# Patient Record
Sex: Male | Born: 1965 | Race: White | Hispanic: No | Marital: Single
Health system: Southern US, Community
[De-identification: ages and names within clinical notes are randomized; demographics above are authoritative.]

## PROBLEM LIST (undated history)

## (undated) DIAGNOSIS — K219 Gastro-esophageal reflux disease without esophagitis: Secondary | ICD-10-CM

## (undated) DIAGNOSIS — I499 Cardiac arrhythmia, unspecified: Secondary | ICD-10-CM

## (undated) DIAGNOSIS — I10 Essential (primary) hypertension: Secondary | ICD-10-CM

---

## 2015-03-10 ENCOUNTER — Inpatient Hospital Stay (HOSPITAL_COMMUNITY)
Admission: AD | Admit: 2015-03-10 | Discharge: 2015-03-14 | DRG: 392 | Disposition: A | Discharge status: Skilled Nursing Facility | Payer: Medicare Other | Source: Other Acute Inpatient Hospital | Attending: Internal Medicine | Admitting: Internal Medicine

## 2015-03-10 DIAGNOSIS — K920 Hematemesis: Secondary | ICD-10-CM | POA: Diagnosis not present

## 2015-03-10 DIAGNOSIS — Z7401 Bed confinement status: Secondary | ICD-10-CM | POA: Diagnosis not present

## 2015-03-10 DIAGNOSIS — K219 Gastro-esophageal reflux disease without esophagitis: Secondary | ICD-10-CM | POA: Diagnosis present

## 2015-03-10 DIAGNOSIS — R252 Cramp and spasm: Secondary | ICD-10-CM | POA: Diagnosis present

## 2015-03-10 DIAGNOSIS — Y732 Prosthetic and other implants, materials and accessory gastroenterology and urology devices associated with adverse incidents: Secondary | ICD-10-CM | POA: Diagnosis present

## 2015-03-10 DIAGNOSIS — Z931 Gastrostomy status: Secondary | ICD-10-CM

## 2015-03-10 DIAGNOSIS — G800 Spastic quadriplegic cerebral palsy: Secondary | ICD-10-CM | POA: Diagnosis not present

## 2015-03-10 DIAGNOSIS — D72829 Elevated white blood cell count, unspecified: Secondary | ICD-10-CM | POA: Diagnosis present

## 2015-03-10 DIAGNOSIS — Z79899 Other long term (current) drug therapy: Secondary | ICD-10-CM

## 2015-03-10 DIAGNOSIS — Z66 Do not resuscitate: Secondary | ICD-10-CM | POA: Diagnosis present

## 2015-03-10 DIAGNOSIS — R1310 Dysphagia, unspecified: Secondary | ICD-10-CM | POA: Diagnosis present

## 2015-03-10 DIAGNOSIS — Y833 Surgical operation with formation of external stoma as the cause of abnormal reaction of the patient, or of later complication, without mention of misadventure at the time of the procedure: Secondary | ICD-10-CM | POA: Diagnosis present

## 2015-03-10 DIAGNOSIS — I10 Essential (primary) hypertension: Secondary | ICD-10-CM | POA: Diagnosis present

## 2015-03-10 DIAGNOSIS — K5641 Fecal impaction: Secondary | ICD-10-CM | POA: Diagnosis present

## 2015-03-10 DIAGNOSIS — E876 Hypokalemia: Secondary | ICD-10-CM | POA: Diagnosis present

## 2015-03-10 DIAGNOSIS — K210 Gastro-esophageal reflux disease with esophagitis: Secondary | ICD-10-CM | POA: Diagnosis present

## 2015-03-10 DIAGNOSIS — R112 Nausea with vomiting, unspecified: Secondary | ICD-10-CM

## 2015-03-10 DIAGNOSIS — Z452 Encounter for adjustment and management of vascular access device: Secondary | ICD-10-CM

## 2015-03-10 DIAGNOSIS — K9423 Gastrostomy malfunction: Secondary | ICD-10-CM | POA: Diagnosis present

## 2015-03-10 DIAGNOSIS — G809 Cerebral palsy, unspecified: Secondary | ICD-10-CM | POA: Diagnosis present

## 2015-03-10 DIAGNOSIS — R258 Other abnormal involuntary movements: Secondary | ICD-10-CM

## 2015-03-10 DIAGNOSIS — R1110 Vomiting, unspecified: Secondary | ICD-10-CM | POA: Diagnosis present

## 2015-03-10 HISTORY — DX: Essential (primary) hypertension: I10

## 2015-03-10 HISTORY — DX: Gastro-esophageal reflux disease without esophagitis: K21.9

## 2015-03-10 HISTORY — DX: Cardiac arrhythmia, unspecified: I49.9

## 2015-03-10 MED ORDER — ONDANSETRON HCL 4 MG PO TABS: 4.0000 mg | ORAL_TABLET | Freq: Four times a day (QID) | ORAL | Status: DC | PRN

## 2015-03-10 MED ORDER — ONDANSETRON HCL 4 MG/2ML IJ SOLN: 4.0000 mg | Freq: Four times a day (QID) | INTRAMUSCULAR | Status: DC | PRN

## 2015-03-10 MED ORDER — BACLOFEN 20 MG PO TABS
20.0000 mg | ORAL_TABLET | Freq: Three times a day (TID) | ORAL | Status: DC
  Administered 2015-03-11 – 2015-03-14 (×5): 20 mg
  Filled 2015-03-10 (×6): qty 1

## 2015-03-10 MED ORDER — METOPROLOL TARTRATE 25 MG PO TABS
25.0000 mg | ORAL_TABLET | Freq: Three times a day (TID) | ORAL | Status: DC
  Administered 2015-03-11 – 2015-03-14 (×6): 25 mg
  Filled 2015-03-10: qty 1
  Filled 2015-03-10: qty 2
  Filled 2015-03-10 (×5): qty 1

## 2015-03-10 MED ORDER — ACETAMINOPHEN 650 MG RE SUPP: 650.0000 mg | Freq: Four times a day (QID) | RECTAL | Status: DC | PRN

## 2015-03-10 MED ORDER — GLYCOPYRROLATE 1 MG PO TABS
1.0000 mg | ORAL_TABLET | Freq: Two times a day (BID) | ORAL | Status: DC
  Administered 2015-03-11 – 2015-03-14 (×4): 1 mg
  Filled 2015-03-10 (×9): qty 1

## 2015-03-10 MED ORDER — ACETAMINOPHEN 325 MG PO TABS: 650.0000 mg | ORAL_TABLET | Freq: Four times a day (QID) | ORAL | Status: DC | PRN

## 2015-03-10 MED ORDER — IPRATROPIUM-ALBUTEROL 0.5-2.5 (3) MG/3ML IN SOLN: 3.0000 mL | RESPIRATORY_TRACT | Status: DC | PRN

## 2015-03-10 MED ORDER — PANTOPRAZOLE SODIUM 40 MG IV SOLR
40.0000 mg | Freq: Two times a day (BID) | INTRAVENOUS | Status: DC
  Administered 2015-03-11 (×2): 40 mg via INTRAVENOUS
  Filled 2015-03-10 (×2): qty 40

## 2015-03-10 MED ORDER — SODIUM CHLORIDE 0.9 % IJ SOLN
3.0000 mL | Freq: Two times a day (BID) | INTRAMUSCULAR | Status: DC
  Administered 2015-03-11 – 2015-03-14 (×4): 3 mL via INTRAVENOUS

## 2015-03-10 MED ORDER — SODIUM CHLORIDE 0.9 % IV SOLN
INTRAVENOUS | Status: DC
  Administered 2015-03-10 – 2015-03-11 (×2): via INTRAVENOUS

## 2015-03-10 MED ORDER — PHENOBARBITAL SODIUM 65 MG/ML IJ SOLN
65.0000 mg | Freq: Two times a day (BID) | INTRAMUSCULAR | Status: DC
  Administered 2015-03-11 – 2015-03-13 (×7): 65 mg via INTRAVENOUS
  Filled 2015-03-10 (×7): qty 1

## 2015-03-10 NOTE — H&P (Signed)
[facility] History and Physical  Patient: Marcus Reid  MRN: [id number]  DOB: 13-May-1966  DOS: the patient was seen and examined on 03/10/2015 PCP: No primary care provider on file.  Referring physician: [facility] Chief Complaint: Coffee-ground emesis  HPI: Marcus Reid is a 49 y.o. male with Past medical history of cerebral palsy, chronic spasticity, GERD, PEG tube placement, chronic dysphagia. Patient presents with complaints of one episode of coffee-ground emesis. The patient is a resident at her rehabilitation facility and was found to be having one episode of copious amount of coffee-ground color emesis. Patient did not have any further episodes of vomiting and evaluation. There was no fall, injury no fever or chills no diarrhea or constipation. Patient did not have any new cough. There is no recent change in his medication. Discussion with the Nursing Home Personnel the Patient Is a DO NOT RESUSCITATE and They will Faxed to Document.  The patient is coming from SNF.  At his baseline basically bedbound And is dependent for most of his ADL; does not manages his medication on his own.  Review of Systems: as mentioned in the history of present illness.  A comprehensive review of the other systems is negative.  No past medical history on file. No past surgical history on file. Social History:  has no tobacco, alcohol, and drug history on file.  Allergies  Allergen Reactions  . Cephalosporins     No family history on file.  Prior to Admission medications   Medication Sig Start Date End Date Taking? Authorizing Provider  aspirin EC 81 MG tablet Take 81 mg by mouth daily.   Yes Historical Provider, MD  baclofen (LIORESAL) 20 MG tablet Give 20 mg by tube 3 (three) times daily.   Yes Historical Provider, MD  docusate sodium (COLACE) 50 MG capsule Give 50 mg by tube 2 (two) times daily.   Yes Historical Provider, MD  FUROSEMIDE PO Take by mouth.   Yes  Historical Provider, MD  glycopyrrolate (ROBINUL) 1 MG tablet Give 1 mg by tube 2 (two) times daily.   Yes Historical Provider, MD  ipratropium-albuterol (DUONEB) 0.5-2.5 (3) MG/3ML SOLN Take 3 mLs by nebulization.   Yes Historical Provider, MD  Lactulose 20 GM/30ML SOLN Take by mouth.   Yes Historical Provider, MD  metoprolol tartrate (LOPRESSOR) 25 MG tablet Give 25 mg by tube 3 (three) times daily.   Yes Historical Provider, MD  OMEPRAZOLE PO Take by mouth.   Yes Historical Provider, MD  PHENobarbital (LUMINAL) 64.8 MG tablet Give 64.8 mg by tube 2 (two) times daily.   Yes Historical Provider, MD    Physical Exam: Filed Vitals:   03/10/15 2306  BP: 121/88  Pulse: 106  Temp: 98.7 F (37.1 C)  TempSrc: Oral  Resp: 18    General: awake but nonverbal and not following command apparently his baseline. Appear in mild distress Eyes: PERRL ENT: Oral Mucosa clear moist. Neck: no JVD Cardiovascular: S1 and S2 Present, no Murmur, Peripheral Pulses Present Respiratory: Bilateral Air entry equal and Decreased,  Clear to Auscultation, no Crackles, no wheezes Abdomen: Bowel Sound present, Soft and no tenderness Skin: no Rash Extremities: no Pedal edema, no calf tenderness Neurologic: Grossly no focal neuro deficit.  Labs on Admission:  CBC:  Recent Labs Lab 03/10/15 2359 03/11/15 0545  WBC 16.9* 14.0*  NEUTROABS 13.2* 10.7*  HGB 14.4 13.8  HCT 42.7 41.6  MCV 96.2 96.3  PLT 443* 410*    CMP  Component Value Date/Time   NA 137 03/11/2015 0545   K 3.2* 03/11/2015 0545   CL 98* 03/11/2015 0545   CO2 31 03/11/2015 0545   GLUCOSE 99 03/11/2015 0545   BUN 15 03/11/2015 0545   CREATININE 0.49* 03/11/2015 0545   CALCIUM 8.7* 03/11/2015 0545   PROT 6.3* 03/11/2015 0545   ALBUMIN 3.0* 03/11/2015 0545   AST 19 03/11/2015 0545   ALT 20 03/11/2015 0545   ALKPHOS 99 03/11/2015 0545   BILITOT 0.4 03/11/2015 0545   GFRNONAA >60 03/11/2015 0545   GFRAA >60 03/11/2015 0545     No results for input(s): CKTOTAL, CKMB, CKMBINDEX, TROPONINI in the last 168 hours. BNP (last 3 results) No results for input(s): BNP in the last 8760 hours.  ProBNP (last 3 results) No results for input(s): PROBNP in the last 8760 hours.   Radiological Exams on Admission: Portable Chest 1 View  03/11/2015   CLINICAL DATA:  Nausea and vomiting.  EXAM: PORTABLE CHEST 1 VIEW  COMPARISON:  None.  FINDINGS: There is a right jugular central line with tip in the SVC just below the azygos vein junction. The lungs are clear. There is no pneumothorax. There is no large effusion. Pulmonary vasculature is normal.  IMPRESSION: No acute cardiopulmonary findings. Satisfactorily positioned right jugular central line.   Electronically Signed   By: Ellery Plunk M.D.   On: 03/11/2015 03:07   Dg Abd Portable 1v  03/11/2015   CLINICAL DATA:  Acute onset of nausea and vomiting. G-tube placement. Initial encounter.  EXAM: PORTABLE ABDOMEN - 1 VIEW  COMPARISON:  None.  FINDINGS: The patient's G-tube is noted ending overlying the antrum of the stomach.  The visualized bowel gas pattern is grossly unremarkable. Scattered air and stool are seen within the colon. No bowel dilatation is seen to suggest obstruction. There is dilatation of the rectum to 10.9 cm with dense stool, compatible with fecal impaction. No free intra-abdominal air is seen, though evaluation for free air is limited on a single supine view.  No acute osseous abnormalities are identified.  IMPRESSION: 1. Dilatation of the rectum to 10.9 cm with dense stool, compatible with fecal impaction. 2. Otherwise unremarkable bowel gas pattern. No free intra-abdominal air seen.   Electronically Signed   By: Roanna Raider M.D.   On: 03/11/2015 03:08   Assessment/Plan 1. Coffee ground emesis The patient presents with complaints of one episode of coffee-ground emesis. At present his H&H is stable he's hemodynamically stable. Patient was discussed with GI  before transfer here. At present he will receive Protonix every 12 hours. He remains nothing by mouth. Next and holding his feeding as well.  2.   Cerebral palsy [facility]) Patient has chronic spasticity secondary to cerebral palsy continue his home medications.  3  S/P percutaneous endoscopic gastrostomy (PEG) tube placement [facility]) Checking x-ray abdomen 2 ensure proper PEG tube placement.  4  Leucocytosis Most likely in distress. At present I do not have any evidence of ongoing infection would continue close monitoring.  5 poor IV access. Patient presents with complaints of coffee-ground emesis. Patient did have poor IV access and the other facility and the attempted and right IJ. Activity other facility was a satisfactory. At present I'll recheck in the x-ray to make sure that the position is maintaining appropriately.  Nutrition: npo DVT Prophylaxis: mechanical compression device  Advance goals of care discussion: DO NOT RESUSCITATE as per discussion with the nursing home facility   Consults: Gastroenterology  Disposition: Admitted  as inpatient, telemetry unit.  Author: Lynden Oxford, MD [facility] Pager: [phone] 03/10/2015  If 7PM-7AM, please contact night-coverage [email or web address] Password [email or web address]

## 2015-03-11 ENCOUNTER — Inpatient Hospital Stay (HOSPITAL_COMMUNITY): Payer: Medicare Other

## 2015-03-11 DIAGNOSIS — R252 Cramp and spasm: Secondary | ICD-10-CM | POA: Diagnosis present

## 2015-03-11 DIAGNOSIS — G809 Cerebral palsy, unspecified: Secondary | ICD-10-CM | POA: Diagnosis present

## 2015-03-11 DIAGNOSIS — D72829 Elevated white blood cell count, unspecified: Secondary | ICD-10-CM | POA: Diagnosis present

## 2015-03-11 DIAGNOSIS — Z931 Gastrostomy status: Secondary | ICD-10-CM

## 2015-03-11 LAB — CBC WITH DIFFERENTIAL/PLATELET
BASOS ABS: 0 10*3/uL (ref 0.0–0.1)
BASOS PCT: 0 %
Basophils Absolute: 0 10*3/uL (ref 0.0–0.1)
Basophils Relative: 0 %
EOS ABS: 0.1 10*3/uL (ref 0.0–0.7)
EOS ABS: 0 10*3/uL (ref 0.0–0.7)
EOS PCT: 1 %
Eosinophils Relative: 0 %
HCT: 41.6 % (ref 39.0–52.0)
HCT: 42.7 % (ref 39.0–52.0)
HEMOGLOBIN: 14.4 g/dL (ref 13.0–17.0)
Hemoglobin: 13.8 g/dL (ref 13.0–17.0)
LYMPHS ABS: 2.6 10*3/uL (ref 0.7–4.0)
LYMPHS PCT: 15 %
Lymphocytes Relative: 15 %
Lymphs Abs: 2.1 10*3/uL (ref 0.7–4.0)
MCH: 31.9 pg (ref 26.0–34.0)
MCH: 32.4 pg (ref 26.0–34.0)
MCHC: 33.2 g/dL (ref 30.0–36.0)
MCHC: 33.7 g/dL (ref 30.0–36.0)
MCV: 96.2 fL (ref 78.0–100.0)
MCV: 96.3 fL (ref 78.0–100.0)
MONO ABS: 1.1 10*3/uL — AB (ref 0.1–1.0)
Monocytes Absolute: 1.2 10*3/uL — ABNORMAL HIGH (ref 0.1–1.0)
Monocytes Relative: 7 %
Monocytes Relative: 8 %
NEUTROS PCT: 78 %
Neutro Abs: 10.7 10*3/uL — ABNORMAL HIGH (ref 1.7–7.7)
Neutro Abs: 13.2 10*3/uL — ABNORMAL HIGH (ref 1.7–7.7)
Neutrophils Relative %: 76 %
PLATELETS: 410 10*3/uL — AB (ref 150–400)
Platelets: 443 10*3/uL — ABNORMAL HIGH (ref 150–400)
RBC: 4.32 MIL/uL (ref 4.22–5.81)
RBC: 4.44 MIL/uL (ref 4.22–5.81)
RDW: 13.9 % (ref 11.5–15.5)
RDW: 14 % (ref 11.5–15.5)
WBC: 14 10*3/uL — ABNORMAL HIGH (ref 4.0–10.5)
WBC: 16.9 10*3/uL — AB (ref 4.0–10.5)

## 2015-03-11 LAB — COMPREHENSIVE METABOLIC PANEL
ALBUMIN: 3 g/dL — AB (ref 3.5–5.0)
ALK PHOS: 97 U/L (ref 38–126)
ALT: 20 U/L (ref 17–63)
ALT: 24 U/L (ref 17–63)
ANION GAP: 8 (ref 5–15)
AST: 26 U/L (ref 15–41)
AST: 19 U/L (ref 15–41)
Albumin: 3.1 g/dL — ABNORMAL LOW (ref 3.5–5.0)
Alkaline Phosphatase: 99 U/L (ref 38–126)
Anion gap: 11 (ref 5–15)
BUN: 15 mg/dL (ref 6–20)
BUN: 17 mg/dL (ref 6–20)
CALCIUM: 9.2 mg/dL (ref 8.9–10.3)
CHLORIDE: 98 mmol/L — AB (ref 101–111)
CO2: 31 mmol/L (ref 22–32)
CO2: 31 mmol/L (ref 22–32)
CREATININE: 0.43 mg/dL — AB (ref 0.61–1.24)
Calcium: 8.7 mg/dL — ABNORMAL LOW (ref 8.9–10.3)
Chloride: 99 mmol/L — ABNORMAL LOW (ref 101–111)
Creatinine, Ser: 0.49 mg/dL — ABNORMAL LOW (ref 0.61–1.24)
GFR calc Af Amer: >60 mL/min (ref >60)
GFR calc non Af Amer: >60 mL/min (ref >60)
GFR calc non Af Amer: >60 mL/min (ref >60)
GLUCOSE: 99 mg/dL (ref 65–99)
GLUCOSE: 94 mg/dL (ref 65–99)
POTASSIUM: 3.2 mmol/L — AB (ref 3.5–5.1)
Potassium: 3.2 mmol/L — ABNORMAL LOW (ref 3.5–5.1)
SODIUM: 141 mmol/L (ref 135–145)
Sodium: 137 mmol/L (ref 135–145)
Total Bilirubin: 0.5 mg/dL (ref 0.3–1.2)
Total Bilirubin: 0.4 mg/dL (ref 0.3–1.2)
Total Protein: 7 g/dL (ref 6.5–8.1)
Total Protein: 6.3 g/dL — ABNORMAL LOW (ref 6.5–8.1)

## 2015-03-11 LAB — PROTIME-INR
INR: 1.13 (ref 0.00–1.49)
Prothrombin Time: 14.7 seconds (ref 11.6–15.2)

## 2015-03-11 LAB — RETICULOCYTES
RBC.: 4.44 MIL/uL (ref 4.22–5.81)
Retic Count, Absolute: 57.7 10*3/uL (ref 19.0–186.0)
Retic Ct Pct: 1.3 % (ref 0.4–3.1)

## 2015-03-11 LAB — LACTIC ACID, PLASMA
LACTIC ACID, VENOUS: 1.2 mmol/L (ref 0.5–2.0)
LACTIC ACID, VENOUS: 2.1 mmol/L — AB (ref 0.5–2.0)

## 2015-03-11 LAB — ABO/RH: ABO/RH(D): A POS

## 2015-03-11 LAB — TYPE AND SCREEN
ABO/RH(D): A POS
ANTIBODY SCREEN: NEGATIVE

## 2015-03-11 LAB — MRSA PCR SCREENING: MRSA by PCR: NEGATIVE

## 2015-03-11 LAB — GLUCOSE, CAPILLARY: Glucose-Capillary: 74 mg/dL (ref 65–99)

## 2015-03-11 MED ORDER — JEVITY 1.2 CAL PO LIQD: 1000.0000 mL | ORAL | Status: DC

## 2015-03-11 MED ORDER — OSMOLITE 1.2 CAL PO LIQD
1000.0000 mL | ORAL | Status: DC
  Administered 2015-03-12: 1000 mL
  Filled 2015-03-11 (×6): qty 1000

## 2015-03-11 MED ORDER — BISACODYL 10 MG RE SUPP: 10.0000 mg | Freq: Every day | RECTAL | Status: DC | PRN

## 2015-03-11 MED ORDER — SODIUM CHLORIDE 0.9 % IJ SOLN
10.0000 mL | INTRAMUSCULAR | Status: DC | PRN
  Administered 2015-03-11 (×2): 20 mL
  Administered 2015-03-11: 40 mL
  Administered 2015-03-12: 20 mL
  Administered 2015-03-12 – 2015-03-14 (×4): 10 mL
  Filled 2015-03-11 (×7): qty 40

## 2015-03-11 MED ORDER — PANTOPRAZOLE SODIUM 40 MG PO PACK
40.0000 mg | PACK | Freq: Two times a day (BID) | ORAL | Status: DC
  Administered 2015-03-12 – 2015-03-14 (×3): 40 mg
  Filled 2015-03-11 (×9): qty 20

## 2015-03-11 MED ORDER — POLYETHYLENE GLYCOL 3350 17 G PO PACK
34.0000 g | PACK | Freq: Every day | ORAL | Status: AC
  Administered 2015-03-12: 34 g
  Filled 2015-03-11 (×2): qty 2

## 2015-03-11 MED ORDER — POTASSIUM CHLORIDE CRYS ER 20 MEQ PO TBCR
30.0000 meq | EXTENDED_RELEASE_TABLET | Freq: Two times a day (BID) | ORAL | Status: AC
  Filled 2015-03-11 (×2): qty 1

## 2015-03-11 MED ORDER — SODIUM BICARBONATE 650 MG PO TABS
650.0000 mg | ORAL_TABLET | Freq: Once | ORAL | Status: AC
  Administered 2015-03-11: 650 mg via ORAL
  Filled 2015-03-11: qty 1

## 2015-03-11 MED ORDER — PANCRELIPASE (LIP-PROT-AMYL) 12000-38000 UNITS PO CPEP
24000.0000 [IU] | ORAL_CAPSULE | Freq: Once | ORAL | Status: AC
  Administered 2015-03-11: 24000 [IU] via ORAL
  Filled 2015-03-11: qty 2

## 2015-03-11 NOTE — Plan of Care (Signed)
Problem: Phase I Progression Outcomes Goal: OOB as tolerated unless otherwise ordered Outcome: Not Met (add Reason) Pt has chronic foley d/t paralysis     

## 2015-03-11 NOTE — Progress Notes (Signed)
Spoke with MD Elgergawy via phone whom stated he will put in order for interventional radiology to de-clog PEG tube tomorrow morning.

## 2015-03-11 NOTE — Progress Notes (Signed)
MD Elgergawy paged, despite multiple attempts over several hours including pharmacological declogging ordered and recommended by pharmacy patient PEG tube is still clogged and will not flush.

## 2015-03-11 NOTE — Progress Notes (Addendum)
Initial Nutrition Assessment  DOCUMENTATION CODES:   Not applicable  INTERVENTION:   Initiate Osmolite 1.2 @ 34ml/hr via PEG and increase by 10 ml every 4 hours to goal rate of 60 ml/hr.   Tube feeding regimen provides 1728 kcal (100% of needs), 80 grams of protein, and 1181 ml of H2O.   NUTRITION DIAGNOSIS:   Inadequate oral intake related to inability to eat as evidenced by NPO status.  GOAL:   Patient will meet greater than or equal to 90% of their needs  MONITOR:   Labs, Weight trends, TF tolerance, Skin, I & O's  REASON FOR ASSESSMENT:   Low Braden, Consult Enteral/tube feeding initiation and management  ASSESSMENT:   Marcus Reid is a 49 y.o. male with Past medical history of cerebral palsy, chronic spasticity, GERD, PEG tube placement, chronic dysphagia. The patient is a resident at her rehabilitation facility and was found to be having one episode of copious amount of coffee-ground color emesis  Pt admitted with coffee ground emesis.   Pt is a resident of [address]. He is PEG dependent, due to dysphagia. Reviewed GI note; suspecting emesis is due to reflux esophagitis-mediated.   Called SNF in attempt to obtain home TF regimen, however, this RD was unable to reach nursing staff at time of call. Pt is non-communicative and unable to provide any hx.  Nutrition-Focused physical exam completed. Findings are no fat depletion, no muscle depletion, and no edema.   Labs reviewed: K: 3.2 (on supplement).  Diet Order:  Diet NPO time specified  Skin:  Reviewed, no issues  Last BM:  03/10/15  Height:   Ht Readings from Last 1 Encounters:  03/10/15  (1.702 m)    Weight:   Wt Readings from Last 1 Encounters:  03/11/15 150 lb 5.7 oz (68.2 kg)    Ideal Body Weight:  67.3 kg  BMI:  Body mass index is 23.54 kg/(m^2).  Estimated Nutritional Needs:   Kcal:  1700-1900  Protein:  75-85 grams  Fluid:  1.7-1.9 L  EDUCATION NEEDS:   No  education needs identified at this time  Kirsten Spearing A. Mayford Knife, RD, LDN, CDE Pager: [phone] After hours Pager: [phone]

## 2015-03-11 NOTE — Progress Notes (Addendum)
Patient PEG tubed clogged despite multiple attempts by Jaynie Collins, per MD Elgergawy Dobhoff tube ordered by secretary Deanna for himself to try and de-clog PEG tube at bedside. Tube feedings on hold as well as any medications administered through PEG tube, MD Elgergawy aware.

## 2015-03-11 NOTE — Progress Notes (Signed)
Asked secretary Pattricia Boss to order kangaroo pump whom verbalized she would do this.

## 2015-03-11 NOTE — Progress Notes (Signed)
Patient Demographics  Marcus Reid, is a 49 y.o. male, DOB - April 04, 1966, ZOX:[id number]  Admit date - 03/10/2015   Admitting Physician Alberteen Sam, MD  Outpatient Primary MD for the patient is No primary care provider on file.  LOS - 1   No chief complaint on file.      Admission HPI/Brief narrative: 49 y.o. male with Past medical history of cerebral palsy, chronic spasticity, GERD, PEG tube placement, chronic dysphagia.Patient presents with complaints of one episode of coffee-ground emesis, hemoglobin is stable, no further recurrence of coffee-ground emesis during hospital stay, GI has been consulted.  Subjective:   Marcus Reid today is non-verbal, can't provide any complaints.  Assessment & Plan    Principal Problem:   Coffee ground emesis Active Problems:   Cerebral palsy (HCC)   S/P percutaneous endoscopic gastrostomy (PEG) tube placement (HCC)   Leucocytosis   Spasticity  Coffee ground emesis - patient presents with complaints of one episode of coffee-ground emesis,  - H&H remains stable,on Protonix every 12 hours, continue to hold aspirin, keep nothing by mouth pending GI evaluation]  Cerebral palsy [facility]) Patient has chronic spasticity secondary to cerebral palsy continue his home medications.  S/P percutaneous endoscopic gastrostomy (PEG) tube placement  - Patient has PEG tube for few years now as per SNF facility - Continue to hold tube feeds.  Hypertension - Continue with metoprolol  Leucocytosis - Most likely related to distress, a febrile, negative chest x-ray, leukocytosis trending down, continue to monitor.  poor IV access  - Has a right IJ TLC inserted by OSH.  Fecal impaction evident on x-ray,  - Will start on laxatives, nurse will attempt disimpaction.  Hypokalemia - Repleted, recheck in a.m.  Code Status: DNR confirmed by SNF  Family  Communication: Spoke with [name] [phone] on 10/3  Disposition Plan: Back to SNF when stable   Procedures  None   Consults   GI   Medications  Scheduled Meds: . baclofen  20 mg Per Tube TID  . glycopyrrolate  1 mg Per Tube BID  . metoprolol tartrate  25 mg Per Tube 3 times per day  . pantoprazole (PROTONIX) IV  40 mg Intravenous Q12H  . PHENObarbital  65 mg Intravenous BID  . sodium chloride  3 mL Intravenous Q12H   Continuous Infusions: . sodium chloride 100 mL/hr at 03/11/15 0328   PRN Meds:.acetaminophen **OR** acetaminophen, bisacodyl, ipratropium-albuterol, ondansetron **OR** ondansetron (ZOFRAN) IV, sodium chloride  DVT Prophylaxis  SCDs   Lab Results  Component Value Date   PLT 410* 03/11/2015    Antibiotics    Anti-infectives    None          Objective:   Filed Vitals:   03/10/15 2306 03/11/15 0500 03/11/15 0559  BP: 121/88  115/86  Pulse: 106  100  Temp: 98.7 F (37.1 C)  98.3 F (36.8 C)  TempSrc: Oral  Oral  Resp: 18  18  Height:  (1.702 m)    Weight: 68 kg (149 lb 14.6 oz) 68.2 kg (150 lb 5.7 oz)   SpO2:   98%    Wt Readings from Last 3 Encounters:  03/11/15 68.2 kg (150 lb 5.7 oz)     Intake/Output Summary (  Last 24 hours) at 03/11/15 1054 Last data filed at 03/11/15 0858  Gross per 24 hour  Intake      0 ml  Output    201 ml  Net   -201 ml     Physical Exam  Sleeping comfortably, nonverbal. Supple Neck,No JVD, right IJ TLC  Symmetrical Chest wall movement, Good air movement bilaterally,  RRR,No Gallops,Rubs , No Parasternal Heave +ve B.Sounds, Abd Soft, No tenderness,PEG +. Contracted.   Data Review   Micro Results Recent Results (from the past 240 hour(s))  MRSA PCR Screening     Status: None   Collection Time: 03/10/15 11:05 PM  Result Value Ref Range Status   MRSA by PCR NEGATIVE NEGATIVE Final    Comment:        The GeneXpert MRSA Assay (FDA approved for NASAL specimens only), is one  component of a comprehensive MRSA colonization surveillance program. It is not intended to diagnose MRSA infection nor to guide or monitor treatment for MRSA infections.     Radiology Reports Portable Chest 1 View  03/11/2015   CLINICAL DATA:  Nausea and vomiting.  EXAM: PORTABLE CHEST 1 VIEW  COMPARISON:  None.  FINDINGS: There is a right jugular central line with tip in the SVC just below the azygos vein junction. The lungs are clear. There is no pneumothorax. There is no large effusion. Pulmonary vasculature is normal.  IMPRESSION: No acute cardiopulmonary findings. Satisfactorily positioned right jugular central line.   Electronically Signed   By: Ellery Plunk M.D.   On: 03/11/2015 03:07   Dg Abd Portable 1v  03/11/2015   CLINICAL DATA:  Acute onset of nausea and vomiting. G-tube placement. Initial encounter.  EXAM: PORTABLE ABDOMEN - 1 VIEW  COMPARISON:  None.  FINDINGS: The patient's G-tube is noted ending overlying the antrum of the stomach.  The visualized bowel gas pattern is grossly unremarkable. Scattered air and stool are seen within the colon. No bowel dilatation is seen to suggest obstruction. There is dilatation of the rectum to 10.9 cm with dense stool, compatible with fecal impaction. No free intra-abdominal air is seen, though evaluation for free air is limited on a single supine view.  No acute osseous abnormalities are identified.  IMPRESSION: 1. Dilatation of the rectum to 10.9 cm with dense stool, compatible with fecal impaction. 2. Otherwise unremarkable bowel gas pattern. No free intra-abdominal air seen.   Electronically Signed   By: Roanna Raider M.D.   On: 03/11/2015 03:08     CBC  Recent Labs Lab 03/10/15 2359 03/11/15 0545  WBC 16.9* 14.0*  HGB 14.4 13.8  HCT 42.7 41.6  PLT 443* 410*  MCV 96.2 96.3  MCH 32.4 31.9  MCHC 33.7 33.2  RDW 13.9 14.0  LYMPHSABS 2.6 2.1  MONOABS 1.2* 1.1*  EOSABS 0.0 0.1  BASOSABS 0.0 0.0    Chemistries   Recent  Labs Lab 03/10/15 2359 03/11/15 0545  NA 141 137  K 3.2* 3.2*  CL 99* 98*  CO2 31 31  GLUCOSE 94 99  BUN 17 15  CREATININE 0.43* 0.49*  CALCIUM 9.2 8.7*  AST 26 19  ALT 24 20  ALKPHOS 97 99  BILITOT 0.5 0.4   ------------------------------------------------------------------------------------------------------------------ estimated creatinine clearance is 105.6 mL/min (by C-G formula based on Cr of 0.49). ------------------------------------------------------------------------------------------------------------------ No results for input(s): HGBA1C in the last 72 hours. ------------------------------------------------------------------------------------------------------------------ No results for input(s): CHOL, HDL, LDLCALC, TRIG, CHOLHDL, LDLDIRECT in the last 72 hours. ------------------------------------------------------------------------------------------------------------------ No results for input(s):  TSH, T4TOTAL, T3FREE, THYROIDAB in the last 72 hours.  Invalid input(s): FREET3 ------------------------------------------------------------------------------------------------------------------  Recent Labs  03/10/15 2359  RETICCTPCT 1.3    Coagulation profile  Recent Labs Lab 03/10/15 2359  INR 1.13    No results for input(s): DDIMER in the last 72 hours.  Cardiac Enzymes No results for input(s): CKMB, TROPONINI, MYOGLOBIN in the last 168 hours.  Invalid input(s): CK ------------------------------------------------------------------------------------------------------------------ Invalid input(s): POCBNP     Time Spent in minutes   30 minutes   ELGERGAWY, DAWOOD M.D on 03/11/2015 at 10:54 AM  Between 7am to 7pm - Pager - [phone]  After 7pm go to [email or web address] - password [facility]  [facility]   Office  [phone]

## 2015-03-11 NOTE — Progress Notes (Signed)
MD Elgergawy paged, Dobhoff tube has arrived and is at bedside.

## 2015-03-11 NOTE — Plan of Care (Signed)
Problem: Phase I Progression Outcomes Goal: OOB as tolerated unless otherwise ordered Outcome: Not Met (add Reason) Pt is quadraplegic.

## 2015-03-11 NOTE — Care Management Note (Addendum)
Case Management Note  Patient Details  Name: Marcus Reid MRN: [id number] Date of Birth: [birth year]  Subjective/Objective:                  Patient from [facility] with coffee ground emesis.  GI consult pending.   Action/Plan:  Anticiapte DC to SNF. Will be facilitated through SW.   03-14-15 DC to SNF today through SW.   Expected Discharge Date:                  Expected Discharge Plan:  Skilled Nursing Facility  In-House Referral:  Clinical Social Work  Discharge planning Services  CM Consult  Post Acute Care Choice:    Choice offered to:     DME Arranged:    DME Agency:     HH Arranged:    HH Agency:     Status of Service:  In process, will continue to follow  Medicare Important Message Given:    Date Medicare IM Given:    Medicare IM give by:    Date Additional Medicare IM Given:    Additional Medicare Important Message give by:     If discussed at Long Length of Stay Meetings, dates discussed:    Additional Comments:  Lawerance Sabal, RN 03/11/2015, 1:43 PM

## 2015-03-11 NOTE — Consult Note (Signed)
[facility] Consultation Note  Referring Provider: Dr.  Mliss Fritz Elgergawy [facility]) Primary Care Physician:  No primary care provider on file.  Reason for Consultation:  Coffee ground emesis  HPI: Marcus Reid is a 49 y.o. male with cerebral palsy and resides at residential facility, transferred here for evaluation of coffee ground emesis at nursing facility.  No bleeding since his arrival, no reported blood in stool or per PEG.  No further hematemesis noted here.  Unable to obtain any history given patient's cerebral palsy and non-verbal and non-communicative state.   No past medical history on file.  No past surgical history on file.  Prior to Admission medications   Medication Sig Start Date End Date Taking? Authorizing Provider  aspirin EC 81 MG tablet Take 81 mg by mouth daily.   Yes Historical Provider, MD  baclofen (LIORESAL) 20 MG tablet Give 20 mg by tube 3 (three) times daily.   Yes Historical Provider, MD  docusate sodium (COLACE) 50 MG capsule Give 50 mg by tube 2 (two) times daily.   Yes Historical Provider, MD  FUROSEMIDE PO Take by mouth.   Yes Historical Provider, MD  glycopyrrolate (ROBINUL) 1 MG tablet Give 1 mg by tube 2 (two) times daily.   Yes Historical Provider, MD  ipratropium-albuterol (DUONEB) 0.5-2.5 (3) MG/3ML SOLN Take 3 mLs by nebulization.   Yes Historical Provider, MD  Lactulose 20 GM/30ML SOLN Take by mouth.   Yes Historical Provider, MD  metoprolol tartrate (LOPRESSOR) 25 MG tablet Give 25 mg by tube 3 (three) times daily.   Yes Historical Provider, MD  OMEPRAZOLE PO Take by mouth.   Yes Historical Provider, MD  PHENobarbital (LUMINAL) 64.8 MG tablet Give 64.8 mg by tube 2 (two) times daily.   Yes Historical Provider, MD    Current Facility-Administered Medications  Medication Dose Route Frequency Provider Last Rate Last Dose  . 0.9 %  sodium chloride infusion   Intravenous Continuous Rolly Salter, MD 100 mL/hr at 03/11/15 0328    .  acetaminophen (TYLENOL) tablet 650 mg  650 mg Oral Q6H PRN Rolly Salter, MD       Or  . acetaminophen (TYLENOL) suppository 650 mg  650 mg Rectal Q6H PRN Rolly Salter, MD      . baclofen (LIORESAL) tablet 20 mg  20 mg Per Tube TID Rolly Salter, MD   20 mg at 03/11/15 0850  . bisacodyl (DULCOLAX) suppository 10 mg  10 mg Rectal Daily PRN Rolly Salter, MD      . glycopyrrolate (ROBINUL) tablet 1 mg  1 mg Per Tube BID Rolly Salter, MD   1 mg at 03/11/15 1015  . ipratropium-albuterol (DUONEB) 0.5-2.5 (3) MG/3ML nebulizer solution 3 mL  3 mL Nebulization Q4H PRN Rolly Salter, MD      . metoprolol tartrate (LOPRESSOR) tablet 25 mg  25 mg Per Tube 3 times per day Rolly Salter, MD   25 mg at 03/11/15 1610  . ondansetron (ZOFRAN) tablet 4 mg  4 mg Oral Q6H PRN Rolly Salter, MD       Or  . ondansetron [facility]) injection 4 mg  4 mg Intravenous Q6H PRN Rolly Salter, MD      . pantoprazole (PROTONIX) injection 40 mg  40 mg Intravenous Q12H Rolly Salter, MD   40 mg at 03/11/15 0849  . PHENObarbital (LUMINAL) injection 65 mg  65 mg Intravenous BID Rolly Salter, MD   65 mg  at 03/11/15 0849  . polyethylene glycol (MIRALAX / GLYCOLAX) packet 34 g  34 g Per Tube Daily Dawood S Elgergawy, MD      . potassium chloride (K-DUR,KLOR-CON) CR tablet 30 mEq  30 mEq Oral BID Leana Roe Elgergawy, MD      . sodium chloride 0.9 % injection 10-40 mL  10-40 mL Intracatheter PRN Rolly Salter, MD   20 mL at 03/11/15 0548  . sodium chloride 0.9 % injection 3 mL  3 mL Intravenous Q12H Rolly Salter, MD   3 mL at 03/11/15 0850    Allergies as of 03/10/2015 - Review Complete 03/10/2015  Allergen Reaction Noted  . Cephalosporins  03/10/2015    No family history on file.  Social History   Social History  . Marital Status: Single    Spouse Name: N/A  . Number of Children: N/A  . Years of Education: N/A   Occupational History  . Not on file.   Social History Main Topics  . Smoking status: Not on  file  . Smokeless tobacco: Not on file  . Alcohol Use: Not on file  . Drug Use: Not on file  . Sexual Activity: Not on file   Other Topics Concern  . Not on file   Social History Narrative  . No narrative on file    Review of Systems: Unable to obtain due to his non-verbal and non-communicative state  Physical Exam: Vital signs in last 24 hours: Temp:  [98.3 F (36.8 C)-98.7 F (37.1 C)] 98.3 F (36.8 C) (10/03 0559) Pulse Rate:  [100-106] 100 (10/03 0559) Resp:  [18] 18 (10/03 0559) BP: (115-121)/(86-88) 115/86 mmHg (10/03 0559) SpO2:  [98 %] 98 % (10/03 0559) Weight:  [68 kg (149 lb 14.6 oz)-68.2 kg (150 lb 5.7 oz)] 68.2 kg (150 lb 5.7 oz) (10/03 0500) Last BM Date: 03/10/15 General:  Contractured, unable to make eye contact or communicate Head:  Normocephalic and atraumatic. Eyes:  Sclera clear, no icterus.   Conjunctiva pink. Ears:  Unable to tell, since he can't communicate or provide signal to my questions Nose:  No deformity, discharge,  or lesions. Mouth:  No deformity or lesions.  Oropharynx pink & moist. Neck:  Central line noted right IJ site.; Supple; no masses or thyromegaly. Lungs:  Clear throughout to auscultation.   No wheezes, crackles, or rhonchi. No acute distress. Heart:  Regular rate and rhythm; no murmurs, clicks, rubs,  or gallops. Abdomen:  Soft, nontender and nondistended; PEG site noted, no weeping or effluent. No masses, hepatosplenomegaly or hernias noted. Normal bowel sounds, without guarding, and without rebound.     Msk:  Contractured diffusely, with tendency to left side Pulses:  Normal pulses noted. Extremities:  Contracted; without clubbing or edema. Neurologic:  Awake but can not tract or follow commands or answer questions Skin:  Bit damp and diaphoretic, otherwise intact without significant lesions or rashes. Cervical Nodes:  No significant cervical adenopathy. Psych:  Alert and cooperative. Normal mood and affect.   Lab  Results:  Recent Labs  03/10/15 2359 03/11/15 0545  WBC 16.9* 14.0*  HGB 14.4 13.8  HCT 42.7 41.6  PLT 443* 410*   BMET  Recent Labs  03/10/15 2359 03/11/15 0545  NA 141 137  K 3.2* 3.2*  CL 99* 98*  CO2 31 31  GLUCOSE 94 99  BUN 17 15  CREATININE 0.43* 0.49*  CALCIUM 9.2 8.7*   LFT  Recent Labs  03/11/15 0545  PROT 6.3*  ALBUMIN 3.0*  AST 19  ALT 20  ALKPHOS 99  BILITOT 0.4   PT/INR  Recent Labs  03/10/15 2359  LABPROT 14.7  INR 1.13    Studies/Results: Portable Chest 1 View  03/11/2015   CLINICAL DATA:  Nausea and vomiting.  EXAM: PORTABLE CHEST 1 VIEW  COMPARISON:  None.  FINDINGS: There is a right jugular central line with tip in the SVC just below the azygos vein junction. The lungs are clear. There is no pneumothorax. There is no large effusion. Pulmonary vasculature is normal.  IMPRESSION: No acute cardiopulmonary findings. Satisfactorily positioned right jugular central line.   Electronically Signed   By: Ellery Plunk M.D.   On: 03/11/2015 03:07   Dg Abd Portable 1v  03/11/2015   CLINICAL DATA:  Acute onset of nausea and vomiting. G-tube placement. Initial encounter.  EXAM: PORTABLE ABDOMEN - 1 VIEW  COMPARISON:  None.  FINDINGS: The patient's G-tube is noted ending overlying the antrum of the stomach.  The visualized bowel gas pattern is grossly unremarkable. Scattered air and stool are seen within the colon. No bowel dilatation is seen to suggest obstruction. There is dilatation of the rectum to 10.9 cm with dense stool, compatible with fecal impaction. No free intra-abdominal air is seen, though evaluation for free air is limited on a single supine view.  No acute osseous abnormalities are identified.  IMPRESSION: 1. Dilatation of the rectum to 10.9 cm with dense stool, compatible with fecal impaction. 2. Otherwise unremarkable bowel gas pattern. No free intra-abdominal air seen.   Electronically Signed   By: Roanna Raider M.D.   On: 03/11/2015  03:08   Impression:  1.  Coffee ground emesis at nursing facility by report.  No overt GI bleeding since admission.  Suspect this is likely reflux esophagitis-mediated. 2.  Cerebral palsy with chronic dysphagia, chronic PEG feeds.  Plan:  1.  PPI per PEG tube (equivalent of 40 mg po bid), now and indefinitely. 2.  Reflux precautions (keep head-of-bed elevated 30-45 degrees at all times). 3.  Small, frequent PEG feedings, would avoid large bolus feeds, as might predispose to reflux. 4.  Can flush PEG tube to assess for blood if clinical suspicion of ongoing bleeding persists. 5.  Don't see need for endoscopy based on patient's current symptoms. 6.  Will sign-off; thank you for the consultation.   LOS: 1 day   Onyx Edgley M  03/11/2015, 12:03 PM  Pager [phone] If no answer or after 5 PM call [phone]

## 2015-03-12 ENCOUNTER — Inpatient Hospital Stay (HOSPITAL_COMMUNITY): Payer: Medicare Other

## 2015-03-12 LAB — URINALYSIS, ROUTINE W REFLEX MICROSCOPIC
BILIRUBIN URINE: NEGATIVE
Glucose, UA: NEGATIVE mg/dL
NITRITE: NEGATIVE
PROTEIN: NEGATIVE mg/dL
Specific Gravity, Urine: 1.019 (ref 1.005–1.030)
UROBILINOGEN UA: 1 mg/dL (ref 0.0–1.0)
pH: 7 (ref 5.0–8.0)

## 2015-03-12 LAB — BASIC METABOLIC PANEL
Anion gap: 6 (ref 5–15)
BUN: 12 mg/dL (ref 6–20)
CHLORIDE: 106 mmol/L (ref 101–111)
CO2: 29 mmol/L (ref 22–32)
Calcium: 8.3 mg/dL — ABNORMAL LOW (ref 8.9–10.3)
Creatinine, Ser: 0.42 mg/dL — ABNORMAL LOW (ref 0.61–1.24)
GFR calc Af Amer: >60 mL/min (ref >60)
GFR calc non Af Amer: >60 mL/min (ref >60)
GLUCOSE: 75 mg/dL (ref 65–99)
POTASSIUM: 3.2 mmol/L — AB (ref 3.5–5.1)
Sodium: 141 mmol/L (ref 135–145)

## 2015-03-12 LAB — CBC
HCT: 36.4 % — ABNORMAL LOW (ref 39.0–52.0)
HEMOGLOBIN: 12 g/dL — AB (ref 13.0–17.0)
MCH: 32.7 pg (ref 26.0–34.0)
MCHC: 33 g/dL (ref 30.0–36.0)
MCV: 99.2 fL (ref 78.0–100.0)
PLATELETS: 307 10*3/uL (ref 150–400)
RBC: 3.67 MIL/uL — AB (ref 4.22–5.81)
RDW: 14.1 % (ref 11.5–15.5)
WBC: 14.7 10*3/uL — ABNORMAL HIGH (ref 4.0–10.5)

## 2015-03-12 LAB — URINE MICROSCOPIC-ADD ON

## 2015-03-12 LAB — GLUCOSE, CAPILLARY
GLUCOSE-CAPILLARY: 66 mg/dL (ref 65–99)
GLUCOSE-CAPILLARY: 72 mg/dL (ref 65–99)
GLUCOSE-CAPILLARY: 79 mg/dL (ref 65–99)
Glucose-Capillary: 56 mg/dL — ABNORMAL LOW (ref 65–99)
Glucose-Capillary: 119 mg/dL — ABNORMAL HIGH (ref 65–99)

## 2015-03-12 MED ORDER — DEXTROSE 50 % IV SOLN
25.0000 mL | Freq: Once | INTRAVENOUS | Status: AC
  Administered 2015-03-12: 25 mL via INTRAVENOUS
  Filled 2015-03-12: qty 50

## 2015-03-12 MED ORDER — DEXTROSE-NACL 5-0.45 % IV SOLN
INTRAVENOUS | Status: DC
  Administered 2015-03-12 – 2015-03-13 (×2): via INTRAVENOUS
  Administered 2015-03-14: 75 mL/h via INTRAVENOUS

## 2015-03-12 MED ORDER — POTASSIUM CHLORIDE 10 MEQ/100ML IV SOLN
10.0000 meq | INTRAVENOUS | Status: AC
  Administered 2015-03-12 (×3): 10 meq via INTRAVENOUS
  Filled 2015-03-12 (×3): qty 100

## 2015-03-12 NOTE — Progress Notes (Signed)
PEG tube was noticed to be out, instructed nurse to insert Foley catheter to keep open, discussed with IR, they'll reinsert in a.m.Marland Kitchen Huey Bienenstock MD

## 2015-03-12 NOTE — Progress Notes (Signed)
Patient Demographics  Marcus Reid, is a 49 y.o. male, DOB - 1965/08/11, GNF:[id number]  Admit date - 03/10/2015   Admitting Physician Alberteen Sam, MD  Outpatient Primary MD for the patient is No primary care provider on file.  LOS - 2   No chief complaint on file.      Admission HPI/Brief narrative: 49 y.o. male with Past medical history of cerebral palsy, chronic spasticity, GERD, PEG tube placement, chronic dysphagia.Patient presents with complaints of one episode of coffee-ground emesis, hemoglobin is stable, no further recurrence of coffee-ground emesis during hospital stay, GI has been consulted, patient is for Protonix twice a day as his symptoms most likely related to esophagitis, and resume tube feeds, unfortunately PEG tube has been clogged, seen by IR for declotting.  Subjective:   [name] today is non-verbal, can't provide any complaints.  Assessment & Plan    Principal Problem:   Coffee ground emesis Active Problems:   Cerebral palsy (HCC)   S/P percutaneous endoscopic gastrostomy (PEG) tube placement (HCC)   Leucocytosis   Spasticity  Coffee ground emesis - presents with complaints of one episode of coffee-ground emesis, no recurrence during hospital stay. - GI consult appreciated, symptoms most likely related to reflux esophagitis, plan is to continue with PPI twice a day, head of bed elevation, avoid large bolus feeds, no need for endoscopy based on patient's current symptoms, GI signed off. - We'll monitor H&H closely, monitor hemoglobin dropped from 14.4 on admission to 12 today, this is most likely dilutional as he is on IV fluid.  Dysfunctional PEG tube  - Patient has PEG tube for few years now as per SNF facility - Was clogged, seen by IR today, was declogged, will resume on tube feed.   Cerebral palsy [facility]) Patient has chronic spasticity secondary  to cerebral palsy continue his home medications.  Hypertension - Continue with metoprolol  Leucocytosis - Most likely related to distress, afebrile, negative chest x-ray, leukocytosis trending down, will check urinalysis.  poor IV access  - Has a right IJ TLC inserted by OSH.  Fecal impaction evident on x-ray,  - Patient is on laxatives, had good bowel movement yesterday..  Hypokalemia - Repleted, recheck in a.m.  Code Status: DNR confirmed by SNF  Family Communication: Spoke with [name] [phone] on 10/3  Disposition Plan: Back to SNF in 24 hours if remains stable.   Procedures  None   Consults   GI   Medications  Scheduled Meds: . baclofen  20 mg Per Tube TID  . glycopyrrolate  1 mg Per Tube BID  . metoprolol tartrate  25 mg Per Tube 3 times per day  . pantoprazole sodium  40 mg Per Tube BID  . PHENObarbital  65 mg Intravenous BID  . polyethylene glycol  34 g Per Tube Daily  . potassium chloride  10 mEq Intravenous Q1 Hr x 3  . sodium chloride  3 mL Intravenous Q12H   Continuous Infusions: . sodium chloride 100 mL/hr at 03/11/15 2256  . feeding supplement (OSMOLITE 1.2 CAL)     PRN Meds:.acetaminophen **OR** acetaminophen, bisacodyl, ipratropium-albuterol, ondansetron **OR** ondansetron (ZOFRAN) IV, sodium chloride  DVT Prophylaxis  SCDs   Lab Results  Component Value Date  PLT 307 03/12/2015    Antibiotics    Anti-infectives    None          Objective:   Filed Vitals:   03/11/15 1258 03/11/15 2138 03/12/15 0435 03/12/15 0557  BP: 112/82 110/79 109/79   Pulse: 98 75 70   Temp: 99 F (37.2 C) 98.5 F (36.9 C) 97.6 F (36.4 C)   TempSrc: Oral Oral    Resp: Height:      Weight:    72 kg (158 lb 11.7 oz)  SpO2: 98% 97% 100%     Wt Readings from Last 3 Encounters:  03/12/15 72 kg (158 lb 11.7 oz)     Intake/Output Summary (Last 24 hours) at 03/12/15 1248 Last data filed at 03/12/15 0437  Gross per 24 hour    Intake 1644.17 ml  Output    625 ml  Net 1019.17 ml     Physical Exam  appears comfortable, nonverbal. Supple Neck,No JVD, right IJ TLC  Symmetrical Chest wall movement, Good air movement bilaterally,  RRR,No Gallops,Rubs , No Parasternal Heave +ve B.Sounds, Abd Soft, No tenderness,PEG +. Contracted.   Data Review   Micro Results Recent Results (from the past 240 hour(s))  MRSA PCR Screening     Status: None   Collection Time: 03/10/15 11:05 PM  Result Value Ref Range Status   MRSA by PCR NEGATIVE NEGATIVE Final    Comment:        The GeneXpert MRSA Assay (FDA approved for NASAL specimens only), is one component of a comprehensive MRSA colonization surveillance program. It is not intended to diagnose MRSA infection nor to guide or monitor treatment for MRSA infections.     Radiology Reports Portable Chest 1 View  03/11/2015   CLINICAL DATA:  Nausea and vomiting.  EXAM: PORTABLE CHEST 1 VIEW  COMPARISON:  None.  FINDINGS: There is a right jugular central line with tip in the SVC just below the azygos vein junction. The lungs are clear. There is no pneumothorax. There is no large effusion. Pulmonary vasculature is normal.  IMPRESSION: No acute cardiopulmonary findings. Satisfactorily positioned right jugular central line.   Electronically Signed   By: Ellery Plunk M.D.   On: 03/11/2015 03:07   Dg Abd Portable 1v  03/11/2015   CLINICAL DATA:  Acute onset of nausea and vomiting. G-tube placement. Initial encounter.  EXAM: PORTABLE ABDOMEN - 1 VIEW  COMPARISON:  None.  FINDINGS: The patient's G-tube is noted ending overlying the antrum of the stomach.  The visualized bowel gas pattern is grossly unremarkable. Scattered air and stool are seen within the colon. No bowel dilatation is seen to suggest obstruction. There is dilatation of the rectum to 10.9 cm with dense stool, compatible with fecal impaction. No free intra-abdominal air is seen, though evaluation for free air is  limited on a single supine view.  No acute osseous abnormalities are identified.  IMPRESSION: 1. Dilatation of the rectum to 10.9 cm with dense stool, compatible with fecal impaction. 2. Otherwise unremarkable bowel gas pattern. No free intra-abdominal air seen.   Electronically Signed   By: Roanna Raider M.D.   On: 03/11/2015 03:08   Ir Patient Eval Tech 0-60 Mins  03/12/2015   Kristopher P Hines     03/12/2015 11:31 AM Unclogged feeding tube bedside.      CBC  Recent Labs Lab 03/10/15 2359 03/11/15 0545 03/12/15 0514  WBC 16.9* 14.0* 14.7*  HGB 14.4 13.8 12.0*  HCT 42.7 41.6 36.4*  PLT 443* 410* 307  MCV 96.2 96.3 99.2  MCH 32.4 31.9 32.7  MCHC 33.7 33.2 33.0  RDW 13.9 14.0 14.1  LYMPHSABS 2.6 2.1  --   MONOABS 1.2* 1.1*  --   EOSABS 0.0 0.1  --   BASOSABS 0.0 0.0  --     Chemistries   Recent Labs Lab 03/10/15 2359 03/11/15 0545 03/12/15 0514  NA 141 137 141  K 3.2* 3.2* 3.2*  CL 99* 98* 106  CO2 GLUCOSE 94 99 75  BUN CREATININE 0.43* 0.49* 0.42*  CALCIUM 9.2 8.7* 8.3*  AST 26 19  --   ALT 24 20  --   ALKPHOS 97 99  --   BILITOT 0.5 0.4  --    ------------------------------------------------------------------------------------------------------------------ estimated creatinine clearance is 105.6 mL/min (by C-G formula based on Cr of 0.42). ------------------------------------------------------------------------------------------------------------------ No results for input(s): HGBA1C in the last 72 hours. ------------------------------------------------------------------------------------------------------------------ No results for input(s): CHOL, HDL, LDLCALC, TRIG, CHOLHDL, LDLDIRECT in the last 72 hours. ------------------------------------------------------------------------------------------------------------------ No results for input(s): TSH, T4TOTAL, T3FREE, THYROIDAB in the last 72 hours.  Invalid input(s):  FREET3 ------------------------------------------------------------------------------------------------------------------  Recent Labs  03/10/15 2359  RETICCTPCT 1.3    Coagulation profile  Recent Labs Lab 03/10/15 2359  INR 1.13    No results for input(s): DDIMER in the last 72 hours.  Cardiac Enzymes No results for input(s): CKMB, TROPONINI, MYOGLOBIN in the last 168 hours.  Invalid input(s): CK ------------------------------------------------------------------------------------------------------------------ Invalid input(s): POCBNP     Time Spent in minutes   30 minutes   Chevelle Coulson M.D on 03/12/2015 at 12:48 PM  Between 7am to 7pm - Pager - [phone]  After 7pm go to [email or web address] - password [facility]  [facility]   Office  [phone]

## 2015-03-12 NOTE — Procedures (Signed)
Unclogged feeding tube bedside.

## 2015-03-13 ENCOUNTER — Inpatient Hospital Stay (HOSPITAL_COMMUNITY): Payer: Medicare Other

## 2015-03-13 DIAGNOSIS — G800 Spastic quadriplegic cerebral palsy: Secondary | ICD-10-CM

## 2015-03-13 DIAGNOSIS — K920 Hematemesis: Secondary | ICD-10-CM

## 2015-03-13 DIAGNOSIS — Z931 Gastrostomy status: Secondary | ICD-10-CM

## 2015-03-13 DIAGNOSIS — D72829 Elevated white blood cell count, unspecified: Secondary | ICD-10-CM

## 2015-03-13 LAB — GLUCOSE, CAPILLARY
GLUCOSE-CAPILLARY: 83 mg/dL (ref 65–99)
Glucose-Capillary: 78 mg/dL (ref 65–99)
Glucose-Capillary: 79 mg/dL (ref 65–99)
Glucose-Capillary: 90 mg/dL (ref 65–99)
Glucose-Capillary: 93 mg/dL (ref 65–99)

## 2015-03-13 LAB — CBC
HEMATOCRIT: 34.3 % — AB (ref 39.0–52.0)
Hemoglobin: 11.2 g/dL — ABNORMAL LOW (ref 13.0–17.0)
MCH: 32.3 pg (ref 26.0–34.0)
MCHC: 32.7 g/dL (ref 30.0–36.0)
MCV: 98.8 fL (ref 78.0–100.0)
Platelets: 283 10*3/uL (ref 150–400)
RBC: 3.47 MIL/uL — ABNORMAL LOW (ref 4.22–5.81)
RDW: 14 % (ref 11.5–15.5)
WBC: 7.8 10*3/uL (ref 4.0–10.5)

## 2015-03-13 LAB — BASIC METABOLIC PANEL
Anion gap: 6 (ref 5–15)
BUN: 8 mg/dL (ref 6–20)
CALCIUM: 8 mg/dL — AB (ref 8.9–10.3)
CO2: 26 mmol/L (ref 22–32)
CREATININE: 0.4 mg/dL — AB (ref 0.61–1.24)
Chloride: 105 mmol/L (ref 101–111)
GFR calc non Af Amer: >60 mL/min (ref >60)
GLUCOSE: 78 mg/dL (ref 65–99)
Potassium: 4.2 mmol/L (ref 3.5–5.1)
Sodium: 137 mmol/L (ref 135–145)

## 2015-03-13 MED ORDER — IOHEXOL 300 MG/ML  SOLN
50.0000 mL | Freq: Once | INTRAMUSCULAR | Status: DC | PRN
  Administered 2015-03-13: 10 mL via INTRAVENOUS
  Filled 2015-03-13: qty 50

## 2015-03-13 MED ORDER — LIDOCAINE VISCOUS 2 % MT SOLN
OROMUCOSAL | Status: AC
  Filled 2015-03-13: qty 15

## 2015-03-13 NOTE — Consult Note (Signed)
Deeric Cruise EdD 

## 2015-03-13 NOTE — Progress Notes (Signed)
Message sent to Schorr NP about tube feeding.  No order to restart at present time feeding

## 2015-03-13 NOTE — Progress Notes (Signed)
Patient Demographics  Marcus Reid, is a 49 y.o. male, DOB - May 27, 1966, ZOX:[id number]  Admit date - 03/10/2015   Admitting Physician Alberteen Sam, MD  Outpatient Primary MD for the patient is No primary care provider on file.  LOS - 3   No chief complaint on file.      Admission HPI/Brief narrative: 49 y.o. male with Past medical history of cerebral palsy, chronic spasticity, GERD, PEG tube placement, chronic dysphagia.Patient presents with complaints of one episode of coffee-ground emesis, hemoglobin is stable, no further recurrence of coffee-ground emesis during hospital stay, GI has been consulted, patient is for Protonix twice a day as his symptoms most likely related to esophagitis, and resume tube feeds, unfortunately PEG tube has been clogged, seen by IR for declotting.  Subjective:   [name] returned from peg tube placement, not able to provide history,   Assessment & Plan    Principal Problem:   Coffee ground emesis Active Problems:   Cerebral palsy (HCC)   S/P percutaneous endoscopic gastrostomy (PEG) tube placement (HCC)   Leucocytosis   Spasticity  Coffee ground emesis - presents with complaints of one episode of coffee-ground emesis, no recurrence during hospital stay. - GI consult appreciated, symptoms most likely related to reflux esophagitis, plan is to continue with PPI twice a day, head of bed elevation, avoid large bolus feeds, no need for endoscopy based on patient's current symptoms, GI signed off. - We'll monitor H&H closely, monitor hemoglobin dropped from 14.4 on admission to 12 today, this is most likely dilutional as he is on IV fluid.  Dysfunctional PEG tube  - Patient has PEG tube for few years now as per SNF facility - Was clogged, seen by IR today, was declogged,  -peg tube fell out, replaced by IR on 10/5   Cerebral palsy [facility]) Patient  has chronic spasticity secondary to cerebral palsy continue his home medications.  Hypertension - Continue with metoprolol  Leucocytosis - Most likely related to distress, afebrile, negative chest x-ray, leukocytosis normalized, no fever,   poor IV access  - Has a right IJ TLC inserted by OSH.  Fecal impaction evident on x-ray,  - Patient is on laxatives, had good bowel movement yesterday..  Hypokalemia - Repleted, recheck in a.m.  Code Status: DNR confirmed by SNF  Family Communication: Spoke with [name] [phone] on 10/3  Disposition Plan: Back to SNF in 24 hours if remains stable.    Procedures  Peg declotting on 10/4 Peg replacement on 10/5   Consults   GI IR   Medications  Scheduled Meds: . baclofen  20 mg Per Tube TID  . glycopyrrolate  1 mg Per Tube BID  . metoprolol tartrate  25 mg Per Tube 3 times per day  . pantoprazole sodium  40 mg Per Tube BID  . PHENObarbital  65 mg Intravenous BID  . polyethylene glycol  34 g Per Tube Daily  . sodium chloride  3 mL Intravenous Q12H   Continuous Infusions: . dextrose 5 % and 0.45% NaCl 75 mL/hr at 03/13/15 1059  . feeding supplement (OSMOLITE 1.2 CAL) 1,000 mL (03/12/15 1604)   PRN Meds:.acetaminophen **OR** acetaminophen, bisacodyl, iohexol, ipratropium-albuterol, ondansetron **OR** ondansetron (ZOFRAN) IV, sodium chloride  DVT  Prophylaxis  SCDs   Lab Results  Component Value Date   PLT 283 03/13/2015    Antibiotics    Anti-infectives    None          Objective:   Filed Vitals:   03/13/15 0532 03/13/15 0830 03/13/15 1128 03/13/15 1640  BP: 101/73 100/70 94/68 103/70  Pulse: 65 63 70 69  Temp: 98.5 F (36.9 C) 99.1 F (37.3 C) 98.9 F (37.2 C)   TempSrc:  Axillary Axillary   Resp: Height:      Weight:      SpO2: 100% 99% 100%     Wt Readings from Last 3 Encounters:  03/13/15 156 lb 8.4 oz (71 kg)     Intake/Output Summary (Last 24 hours) at 03/13/15  1833 Last data filed at 03/13/15 1652  Gross per 24 hour  Intake    120 ml  Output    902 ml  Net   -782 ml     Physical Exam  appears comfortable, nonverbal. Supple Neck,No JVD, right IJ TLC  Symmetrical Chest wall movement, Good air movement bilaterally,  RRR,No Gallops,Rubs , No Parasternal Heave +ve B.Sounds, Abd Soft, No tenderness,PEG +. Contracted.   Data Review   Micro Results Recent Results (from the past 240 hour(s))  MRSA PCR Screening     Status: None   Collection Time: 03/10/15 11:05 PM  Result Value Ref Range Status   MRSA by PCR NEGATIVE NEGATIVE Final    Comment:        The GeneXpert MRSA Assay (FDA approved for NASAL specimens only), is one component of a comprehensive MRSA colonization surveillance program. It is not intended to diagnose MRSA infection nor to guide or monitor treatment for MRSA infections.     Radiology Reports Ir Catheter Tube Change  03/13/2015   CLINICAL DATA:  Dislodged gastrostomy tube. Foley catheter placed in gastrostomy tube tract.  EXAM: REPLACEMENT OF GASTROSTOMY TUBE WITH FLUOROSCOPY  Physician: Rachelle Hora. Henn, MD  FLUOROSCOPY TIME:  6 seconds, 2.7 mGy  MEDICATIONS AND MEDICAL HISTORY: None  ANESTHESIA/SEDATION: Moderate sedation time: None  CONTRAST:  10 mL Omnipaque-300  PROCEDURE: The existing Foley catheter was deflated and removed. A new 18 French balloon retention catheter was easily advanced into the stomach. Balloon was inflated with 9 mL of sterile saline. Contrast was injected to confirm placement in the stomach. Fluoroscopic images were taken and saved for this procedure.  FINDINGS: Gastrostomy tube positioned in the stomach.  Estimated blood loss: None  COMPLICATIONS: None  IMPRESSION: Successful replacement of the gastrostomy tube with fluoroscopy.   Electronically Signed   By: Richarda Overlie M.D.   On: 03/13/2015 13:13   Portable Chest 1 View  03/11/2015   CLINICAL DATA:  Nausea and vomiting.  EXAM: PORTABLE CHEST 1  VIEW  COMPARISON:  None.  FINDINGS: There is a right jugular central line with tip in the SVC just below the azygos vein junction. The lungs are clear. There is no pneumothorax. There is no large effusion. Pulmonary vasculature is normal.  IMPRESSION: No acute cardiopulmonary findings. Satisfactorily positioned right jugular central line.   Electronically Signed   By: Ellery Plunk M.D.   On: 03/11/2015 03:07   Dg Abd Portable 1v  03/11/2015   CLINICAL DATA:  Acute onset of nausea and vomiting. G-tube placement. Initial encounter.  EXAM: PORTABLE ABDOMEN - 1 VIEW  COMPARISON:  None.  FINDINGS: The patient's G-tube is noted ending overlying the antrum  of the stomach.  The visualized bowel gas pattern is grossly unremarkable. Scattered air and stool are seen within the colon. No bowel dilatation is seen to suggest obstruction. There is dilatation of the rectum to 10.9 cm with dense stool, compatible with fecal impaction. No free intra-abdominal air is seen, though evaluation for free air is limited on a single supine view.  No acute osseous abnormalities are identified.  IMPRESSION: 1. Dilatation of the rectum to 10.9 cm with dense stool, compatible with fecal impaction. 2. Otherwise unremarkable bowel gas pattern. No free intra-abdominal air seen.   Electronically Signed   By: Roanna Raider M.D.   On: 03/11/2015 03:08   Ir Patient Eval Tech 0-60 Mins  03/12/2015   Kristopher P Hines     03/12/2015 11:31 AM Unclogged feeding tube bedside.      CBC  Recent Labs Lab 03/10/15 2359 03/11/15 0545 03/12/15 0514 03/13/15 0550  WBC 16.9* 14.0* 14.7* 7.8  HGB 14.4 13.8 12.0* 11.2*  HCT 42.7 41.6 36.4* 34.3*  PLT 443* 410* 307 283  MCV 96.2 96.3 99.2 98.8  MCH 32.4 31.9 32.7 32.3  MCHC 33.7 33.2 33.0 32.7  RDW 13.9 14.0 14.1 14.0  LYMPHSABS 2.6 2.1  --   --   MONOABS 1.2* 1.1*  --   --   EOSABS 0.0 0.1  --   --   BASOSABS 0.0 0.0  --   --     Chemistries   Recent Labs Lab 03/10/15 2359  03/11/15 0545 03/12/15 0514 03/13/15 0550  NA 141 137 141 137  K 3.2* 3.2* 3.2* 4.2  CL 99* 98* 106 105  CO2 GLUCOSE 94 99 75 78  BUN CREATININE 0.43* 0.49* 0.42* 0.40*  CALCIUM 9.2 8.7* 8.3* 8.0*  AST 26 19  --   --   ALT 24 20  --   --   ALKPHOS 97 99  --   --   BILITOT 0.5 0.4  --   --    ------------------------------------------------------------------------------------------------------------------ estimated creatinine clearance is 105.6 mL/min (by C-G formula based on Cr of 0.4). ------------------------------------------------------------------------------------------------------------------ No results for input(s): HGBA1C in the last 72 hours. ------------------------------------------------------------------------------------------------------------------ No results for input(s): CHOL, HDL, LDLCALC, TRIG, CHOLHDL, LDLDIRECT in the last 72 hours. ------------------------------------------------------------------------------------------------------------------ No results for input(s): TSH, T4TOTAL, T3FREE, THYROIDAB in the last 72 hours.  Invalid input(s): FREET3 ------------------------------------------------------------------------------------------------------------------  Recent Labs  03/10/15 2359  RETICCTPCT 1.3    Coagulation profile  Recent Labs Lab 03/10/15 2359  INR 1.13    No results for input(s): DDIMER in the last 72 hours.  Cardiac Enzymes No results for input(s): CKMB, TROPONINI, MYOGLOBIN in the last 168 hours.  Invalid input(s): CK ------------------------------------------------------------------------------------------------------------------ Invalid input(s): POCBNP     Time Spent in minutes   30 minutes   Jullien Granquist MD PhD on 03/13/2015 at 6:33 PM  Between 7am to 7pm - Pager - [phone]  After 7pm go to [email or web address] - password [facility]  [facility]   Office  [phone]

## 2015-03-14 ENCOUNTER — Encounter (HOSPITAL_COMMUNITY): Payer: Self-pay | Admitting: *Deleted

## 2015-03-14 LAB — BASIC METABOLIC PANEL
ANION GAP: 5 (ref 5–15)
CHLORIDE: 104 mmol/L (ref 101–111)
CO2: 27 mmol/L (ref 22–32)
Calcium: 7.9 mg/dL — ABNORMAL LOW (ref 8.9–10.3)
Creatinine, Ser: <0.3 mg/dL — ABNORMAL LOW (ref 0.61–1.24)
Glucose, Bld: 100 mg/dL — ABNORMAL HIGH (ref 65–99)
POTASSIUM: 3.9 mmol/L (ref 3.5–5.1)
SODIUM: 136 mmol/L (ref 135–145)

## 2015-03-14 LAB — CBC
HCT: 33.8 % — ABNORMAL LOW (ref 39.0–52.0)
HEMOGLOBIN: 11.4 g/dL — AB (ref 13.0–17.0)
MCH: 32.6 pg (ref 26.0–34.0)
MCHC: 33.7 g/dL (ref 30.0–36.0)
MCV: 96.6 fL (ref 78.0–100.0)
Platelets: 299 10*3/uL (ref 150–400)
RBC: 3.5 MIL/uL — AB (ref 4.22–5.81)
RDW: 13.8 % (ref 11.5–15.5)
WBC: 8.7 10*3/uL (ref 4.0–10.5)

## 2015-03-14 LAB — MAGNESIUM: MAGNESIUM: 2 mg/dL (ref 1.7–2.4)

## 2015-03-14 LAB — GLUCOSE, CAPILLARY
Glucose-Capillary: 100 mg/dL — ABNORMAL HIGH (ref 65–99)
Glucose-Capillary: 103 mg/dL — ABNORMAL HIGH (ref 65–99)
Glucose-Capillary: 105 mg/dL — ABNORMAL HIGH (ref 65–99)
Glucose-Capillary: 90 mg/dL (ref 65–99)
Glucose-Capillary: 97 mg/dL (ref 65–99)

## 2015-03-14 LAB — TSH: TSH: 1.224 u[IU]/mL (ref 0.350–4.500)

## 2015-03-14 MED ORDER — FUROSEMIDE 10 MG/ML PO SOLN: 40.0000 mg | Freq: Every day | ORAL | Status: AC | PRN

## 2015-03-14 MED ORDER — PHENOBARBITAL 32.4 MG PO TABS
64.8000 mg | ORAL_TABLET | Freq: Two times a day (BID) | ORAL | Status: DC
  Administered 2015-03-14: 64.8 mg via ORAL
  Filled 2015-03-14: qty 2

## 2015-03-14 MED ORDER — OMEPRAZOLE 2 MG/ML ORAL SUSPENSION: 20.0000 mg | Freq: Two times a day (BID) | ORAL | Status: AC

## 2015-03-14 NOTE — Clinical Social Work Note (Signed)
Patient discharging back to [facility] today.  Discharge information transmitted to facility and patient's sister, Ursula Alert contacted and is aware of discharge.  Patient will be transported to SNF by ambulance ([facility]).  Genelle Bal, MSW, LCSW Licensed Clinical Social Worker Clinical Social Work Department [employer] [phone]

## 2015-03-14 NOTE — Clinical Social Work Note (Signed)
Clinical Social Work Assessment  Patient Details  Name: Marcus Reid MRN: [id number] Date of Birth: 12/29/1965  Date of referral:  03/13/15               Reason for consult:  Facility Placement                Permission sought to share information with:  Facility Medical sales representative (CSW talked with patient's mother as Marcus Reid is mute.) Permission granted to share information::  Yes, Verbal Permission Granted  Name::        Agency::     Relationship::  Marcus Reid  Contact Information:     Housing/Transportation Living arrangements for the past 2 months:  Skilled Nursing Facility Source of Information:  Parent Marcus Reid, mother [phone])) Patient Interpreter Needed:  None Criminal Activity/Legal Involvement Pertinent to Current Situation/Hospitalization:  No - Comment as needed Significant Relationships:  Parents Lives with:  Facility Resident [facility]) Do you feel safe going back to the place where you live?  Yes Need for family participation in patient care:  Yes (Comment)  Care giving concerns:  None expressed by patient's mother   Social Worker assessment / plan:  On 03/14/15, CSW talked by phone with patient's mother, Marcus Reid regarding discharge planning.  CSW informed that patient has been at [facility] for approx. 10 years and will return there at discharge. Marcus Reid reported that her son is non-verbal, will blink yes or no at times and moves a little bit.  She would like patient to be at a facility closer to where she lives, and CSW suggested that she talk with SW at the facility for assisting in moving him. Marcus Reid understands that LTC Medicaid beds are hard to come by.  Marcus Reid informed that she will be contacted once patient ready for discharge.   Employment status:  Disabled (Comment on whether or not currently receiving Disability) Insurance information:  Medicare, Medicaid [place] PT Recommendations:  Not  assessed at this time Information / Referral to community resources:  Skilled Nursing Facility  Patient/Family's Response to care:  No concerns expressed by parent.  Patient/Family's Understanding of and Emotional Response to Diagnosis, Current Treatment, and Prognosis:    Emotional Assessment Appearance:  Appears stated age (CSW looked in room but patient was asleep and no family present.) Attitude/Demeanor/Rapport:  Unable to Assess Affect (typically observed):  Unable to Assess Orientation:  Oriented to Self Alcohol / Substance use:  Other (No report of tobacco, alcohol or illicit drug use on file) Psych involvement (Current and /or in the community):  No (Comment)  Discharge Needs  Concerns to be addressed:  Discharge Planning Concerns Readmission within the last 30 days:  No Current discharge risk:  None Barriers to Discharge:  No Barriers Identified   Cristobal Goldmann, LCSW 03/14/2015, 2:55 PM

## 2015-03-14 NOTE — Care Management Important Message (Signed)
Important Message  Patient Details  Name: Marcus Reid MRN: [id number] Date of Birth: Jul 11, 1965   Medicare Important Message Given:  Yes-second notification given    Kyla Balzarine 03/14/2015, 11:22 AM

## 2015-03-14 NOTE — Discharge Summary (Signed)
Discharge Summary  Marcus Reid ZOX:[id number] DOB: [birth year]  PCP: No primary care provider on file.  Admit date: 03/10/2015 Discharge date: 03/14/2015  Time spent: >30mins  Recommendations for Outpatient Follow-up:  1. F/u with PMD at SNF 2. Make sure patient bed elevated at 30degree when getting tube feeds, tube feeds goal decreased to 40cc/hr, check residual q4hrs, hold tube feed if residual >250cc and call MD for further instruction. 3. Foley care per SNF 4.  meds change at discharge: changed lasix to prn for edema, changed omeprazole to bid.  Discharge Diagnoses:  Active Hospital Problems   Diagnosis Date Noted  . Coffee ground emesis 03/10/2015  . Cerebral palsy (HCC) 03/11/2015  . S/P percutaneous endoscopic gastrostomy (PEG) tube placement (HCC) 03/11/2015  . Leucocytosis 03/11/2015  . Spasticity 03/11/2015    Resolved Hospital Problems   Diagnosis Date Noted Date Resolved  No resolved problems to display.    Discharge Condition: stable  Diet recommendation: strict NPO, need oral care  Filed Weights   03/12/15 0557 03/13/15 0515 03/14/15 0500  Weight: 158 lb 11.7 oz (72 kg) 156 lb 8.4 oz (71 kg) 167 lb 8.8 oz (76 kg)    History of present illness:  Marcus Reid is a 48 y.o. male with Past medical history of cerebral palsy, chronic spasticity, GERD, PEG tube placement, chronic dysphagia. Patient presents with complaints of one episode of coffee-ground emesis. The patient is a resident at her rehabilitation facility and was found to be having one episode of copious amount of coffee-ground color emesis. Patient did not have any further episodes of vomiting and evaluation. There was no fall, injury no fever or chills no diarrhea or constipation. Patient did not have any new cough. There is no recent change in his medication. Discussion with the Nursing Home Personnel the Patient Is a DO NOT RESUSCITATE and They will Faxed to Document.  The patient is coming  from SNF.  At his baseline basically bedbound And is dependent for most of his ADL; does not manages his medication on his own.  Hospital Course:  Principal Problem:   Coffee ground emesis Active Problems:   Cerebral palsy (HCC)   S/P percutaneous endoscopic gastrostomy (PEG) tube placement (HCC)   Leucocytosis   Spasticity  Coffee ground emesis - presents with complaints of one episode of coffee-ground emesis, no recurrence during hospital stay. - GI consult appreciated, symptoms most likely related to reflux esophagitis, plan is to continue with PPI twice a day, head of bed elevation, avoid large bolus feeds, no need for endoscopy based on patient's current symptoms, GI signed off. - hgb at admission was 14.4 , at discharge 11.4, has been stable at 11.4 for two days prior to discharge, likely close to baseline, initial hgb 14.4 likely from hemoconcentration.  Dysfunctional PEG tube - Patient has PEG tube for few years now as per SNF facility - Was clogged, was declogged by IR on 10/4  -peg tube dislodged later on 10/4, replaced by IR on 10/5 -tube feeds restarted, SNF to continue to titrate tube feeds.   Cerebral palsy [facility]) Patient has chronic spasticity secondary to cerebral palsy continue his home medications. Baseline nonverbal, bedbound, indwelling foley per SNF.  Hypertension - Continue with metoprolol per tube  Leucocytosis - Most likely related to distress and hemoconcentration, afebrile, negative chest x-ray, leukocytosis normalized, no fever,   poor IV access  - Has a right IJ TLC inserted by OSH. -removed prior to discharge.  Fecal impaction evident on x-ray,  -  Patient is on laxatives, had good bowel movement yesterday.  Hypokalemia - Repleted, mag wnl  Code Status: DNR confirmed by SNF  Family Communication: Spoke with [name] [phone] on 10/3  Disposition Plan: Back to SNF in 24 hours if remains stable.    Procedures  Peg declotting  on 10/4 Peg replacement on 10/5   Consults  GI IR  Discharge Exam: BP 99/72 mmHg  Pulse 64  Temp(Src) 98 F (36.7 C) (Oral)  Resp 16  Ht  (1.702 m)  Wt 167 lb 8.8 oz (76 kg)  BMI 26.24 kg/m2  SpO2 99%  appears comfortable, nonverbal. Supple Neck,No JVD, right IJ TLC  Symmetrical Chest wall movement, Good air movement bilaterally,  RRR,No Gallops,Rubs , No Parasternal Heave +ve B.Sounds, Abd Soft, No tenderness,PEG +. Contracted.   Discharge Instructions You were cared for by a hospitalist during your hospital stay. If you have any questions about your discharge medications or the care you received while you were in the hospital after you are discharged, you can call the unit and asked to speak with the hospitalist on call if the hospitalist that took care of you is not available. Once you are discharged, your primary care physician will handle any further medical issues. Please note that NO REFILLS for any discharge medications will be authorized once you are discharged, as it is imperative that you return to your primary care physician (or establish a relationship with a primary care physician if you do not have one) for your aftercare needs so that they can reassess your need for medications and monitor your lab values.      Discharge Instructions    Increase activity slowly    Complete by:  As directed             Medication List    TAKE these medications        aspirin EC 81 MG tablet  Give 81 mg by tube daily.     baclofen 20 MG tablet  Commonly known as:  LIORESAL  Give 20 mg by tube 3 (three) times daily.     diphenhydrAMINE 12.5 MG/5ML elixir  Commonly known as:  BENADRYL  Place 25 mg into feeding tube 4 (four) times daily as needed for itching or allergies.     docusate sodium 50 MG capsule  Commonly known as:  COLACE  Give 50 mg by tube 2 (two) times daily.     furosemide 10 MG/ML solution  Commonly known as:  LASIX  Place 4 mLs (40 mg  total) into feeding tube daily as needed for fluid or edema.     glycopyrrolate 1 MG tablet  Commonly known as:  ROBINUL  Give 1 mg by tube 2 (two) times daily.     ipratropium-albuterol 0.5-2.5 (3) MG/3ML Soln  Commonly known as:  DUONEB  Take 3 mLs by nebulization every 6 (six) hours as needed (for shortness of  breath).     Lactulose 20 GM/30ML Soln  Give 30 mLs by tube daily.     metoprolol tartrate 25 MG tablet  Commonly known as:  LOPRESSOR  Give 25 mg by tube 3 (three) times daily.     omeprazole 2 mg/mL Susp  Commonly known as:  PRILOSEC  Place 10 mLs (20 mg total) into feeding tube 2 (two) times daily.     PHENobarbital 64.8 MG tablet  Commonly known as:  LUMINAL  Give 64.8 mg by tube 2 (two) times daily.  potassium chloride 20 MEQ/15ML (10%) Soln  Give 20 mEq by tube daily.     promethazine 25 MG tablet  Commonly known as:  PHENERGAN  Give 25 mg by tube every 6 (six) hours as needed for nausea or vomiting.     promethazine 12.5 MG suppository  Commonly known as:  PHENERGAN  Place 12.5 mg rectally every 6 (six) hours as needed for nausea or vomiting.     promethazine 25 MG/ML injection  Commonly known as:  PHENERGAN  Inject 12.5 mg into the vein every 6 (six) hours as needed for nausea or vomiting.     scopolamine 1 MG/3DAYS  Commonly known as:  TRANSDERM-SCOP  Place 1 patch onto the skin every 3 (three) days.     VIVONEX RTF Liqd  Give 90 mLs by tube 2 (two) times daily.       Allergies  Allergen Reactions  . Ancef [Cefazolin] Other (See Comments)    Per MAR  . Cephalosporins Other (See Comments)    Per MAR      The results of significant diagnostics from this hospitalization (including imaging, microbiology, ancillary and laboratory) are listed below for reference.    Significant Diagnostic Studies: Ir Catheter Tube Change  03/13/2015   CLINICAL DATA:  Dislodged gastrostomy tube. Foley catheter placed in gastrostomy tube tract.  EXAM:  REPLACEMENT OF GASTROSTOMY TUBE WITH FLUOROSCOPY  Physician: Rachelle Hora. Henn, MD  FLUOROSCOPY TIME:  6 seconds, 2.7 mGy  MEDICATIONS AND MEDICAL HISTORY: None  ANESTHESIA/SEDATION: Moderate sedation time: None  CONTRAST:  10 mL Omnipaque-300  PROCEDURE: The existing Foley catheter was deflated and removed. A new 18 French balloon retention catheter was easily advanced into the stomach. Balloon was inflated with 9 mL of sterile saline. Contrast was injected to confirm placement in the stomach. Fluoroscopic images were taken and saved for this procedure.  FINDINGS: Gastrostomy tube positioned in the stomach.  Estimated blood loss: None  COMPLICATIONS: None  IMPRESSION: Successful replacement of the gastrostomy tube with fluoroscopy.   Electronically Signed   By: Richarda Overlie M.D.   On: 03/13/2015 13:13   Portable Chest 1 View  03/11/2015   CLINICAL DATA:  Nausea and vomiting.  EXAM: PORTABLE CHEST 1 VIEW  COMPARISON:  None.  FINDINGS: There is a right jugular central line with tip in the SVC just below the azygos vein junction. The lungs are clear. There is no pneumothorax. There is no large effusion. Pulmonary vasculature is normal.  IMPRESSION: No acute cardiopulmonary findings. Satisfactorily positioned right jugular central line.   Electronically Signed   By: Ellery Plunk M.D.   On: 03/11/2015 03:07   Dg Abd Portable 1v  03/11/2015   CLINICAL DATA:  Acute onset of nausea and vomiting. G-tube placement. Initial encounter.  EXAM: PORTABLE ABDOMEN - 1 VIEW  COMPARISON:  None.  FINDINGS: The patient's G-tube is noted ending overlying the antrum of the stomach.  The visualized bowel gas pattern is grossly unremarkable. Scattered air and stool are seen within the colon. No bowel dilatation is seen to suggest obstruction. There is dilatation of the rectum to 10.9 cm with dense stool, compatible with fecal impaction. No free intra-abdominal air is seen, though evaluation for free air is limited on a single supine  view.  No acute osseous abnormalities are identified.  IMPRESSION: 1. Dilatation of the rectum to 10.9 cm with dense stool, compatible with fecal impaction. 2. Otherwise unremarkable bowel gas pattern. No free intra-abdominal air seen.   Electronically Signed  By: Roanna Raider M.D.   On: 03/11/2015 03:08   Ir Patient Eval Tech 0-60 Mins  03/12/2015   Kristopher P Hines     03/12/2015 11:31 AM Unclogged feeding tube bedside.     Microbiology: Recent Results (from the past 240 hour(s))  MRSA PCR Screening     Status: None   Collection Time: 03/10/15 11:05 PM  Result Value Ref Range Status   MRSA by PCR NEGATIVE NEGATIVE Final    Comment:        The GeneXpert MRSA Assay (FDA approved for NASAL specimens only), is one component of a comprehensive MRSA colonization surveillance program. It is not intended to diagnose MRSA infection nor to guide or monitor treatment for MRSA infections.      Labs: Basic Metabolic Panel:  Recent Labs Lab 03/10/15 2359 03/11/15 0545 03/12/15 0514 03/13/15 0550 03/14/15 0512  NA 141 137 141 137 136  K 3.2* 3.2* 3.2* 4.2 3.9  CL 99* 98* 106 105 104  CO2 GLUCOSE 94 99 75 78 100*  BUN <5*  CREATININE 0.43* 0.49* 0.42* 0.40* <0.30*  CALCIUM 9.2 8.7* 8.3* 8.0* 7.9*  MG  --   --   --   --  2.0   Liver Function Tests:  Recent Labs Lab 03/10/15 2359 03/11/15 0545  AST 26 19  ALT 24 20  ALKPHOS 97 99  BILITOT 0.5 0.4  PROT 7.0 6.3*  ALBUMIN 3.1* 3.0*   No results for input(s): LIPASE, AMYLASE in the last 168 hours. No results for input(s): AMMONIA in the last 168 hours. CBC:  Recent Labs Lab 03/10/15 2359 03/11/15 0545 03/12/15 0514 03/13/15 0550 03/14/15 0512  WBC 16.9* 14.0* 14.7* 7.8 8.7  NEUTROABS 13.2* 10.7*  --   --   --   HGB 14.4 13.8 12.0* 11.2* 11.4*  HCT 42.7 41.6 36.4* 34.3* 33.8*  MCV 96.2 96.3 99.2 98.8 96.6  PLT 443* 410* 307 283 299   Cardiac Enzymes: No results for input(s):  CKTOTAL, CKMB, CKMBINDEX, TROPONINI in the last 168 hours. BNP: BNP (last 3 results) No results for input(s): BNP in the last 8760 hours.  ProBNP (last 3 results) No results for input(s): PROBNP in the last 8760 hours.  CBG:  Recent Labs Lab 03/13/15 2048 03/14/15 0004 03/14/15 0355 03/14/15 0757 03/14/15 1220  GLUCAP 90 90 100* 97 103*       Signed:  Orlandria Kissner MD, PhD  [facility] 03/14/2015, 1:52 PM

## 2015-03-14 NOTE — Progress Notes (Signed)
Nutrition Follow-up  DOCUMENTATION CODES:   Not applicable  INTERVENTION:   Resume Osmolite 1.2 @ 1ml/hr via PEG and increase by 10 ml every 4 hours to goal rate of 60 ml/hr.   Tube feeding regimen provides 1728 kcal (100% of needs), 80 grams of protein, and 1181 ml of H2O.   NUTRITION DIAGNOSIS:   Inadequate oral intake related to inability to eat as evidenced by NPO status.  Ongoing  GOAL:   Patient will meet greater than or equal to 90% of their needs  Progressing  MONITOR:   Labs, Weight trends, TF tolerance, Skin, I & O's  REASON FOR ASSESSMENT:   Low Braden, Consult Enteral/tube feeding initiation and management  ASSESSMENT:   Marcus Reid is a 49 y.o. male with Past medical history of cerebral palsy, chronic spasticity, GERD, PEG tube placement, chronic dysphagia. The patient is a resident at her rehabilitation facility and was found to be having one episode of copious amount of coffee-ground color emesis  Pt PEG was clogged on 03/11/13; unclogged by IR. Per chart review, PEG tube fell out and was replaced by IR on 03/13/15.  Spoke with RN, who was seeking clarification for TF orders. She reports she just took verbal order for MD to re-start TF- reviewed previous TF order.   Pt will likely d/c back to SNF today, per RN.   Labs reviewed.  Diet Order:  Diet NPO time specified  Skin:  Reviewed, no issues  Last BM:  03/10/15  Height:   Ht Readings from Last 1 Encounters:  03/10/15  (1.702 m)    Weight:   Wt Readings from Last 1 Encounters:  03/14/15 167 lb 8.8 oz (76 kg)    Ideal Body Weight:  67.3 kg  BMI:  Body mass index is 26.24 kg/(m^2).  Estimated Nutritional Needs:   Kcal:  1700-1900  Protein:  75-85 grams  Fluid:  1.7-1.9 L  EDUCATION NEEDS:   No education needs identified at this time  Skylinn Vialpando A. Mayford Knife, RD, LDN, CDE Pager: [phone] After hours Pager: [phone]

## 2015-03-16 NOTE — Progress Notes (Signed)
Patient NG tube came out after connecting to tube feed. Attending MD called and made aware, new orders rec'd to place temporary 25fr foley in place until GI MD is able to see patient. Tip of 24fr foley inserted, balloon not inflated, tube secured in place with paper tape. Pt. Stable without distress noted. Will continue to monitor.

## 2016-08-13 DIAGNOSIS — K5641 Fecal impaction: Secondary | ICD-10-CM

## 2016-08-13 DIAGNOSIS — G40909 Epilepsy, unspecified, not intractable, without status epilepticus: Secondary | ICD-10-CM | POA: Diagnosis not present

## 2016-08-13 DIAGNOSIS — A419 Sepsis, unspecified organism: Secondary | ICD-10-CM

## 2016-08-13 DIAGNOSIS — L03311 Cellulitis of abdominal wall: Secondary | ICD-10-CM

## 2016-08-14 DIAGNOSIS — L03311 Cellulitis of abdominal wall: Secondary | ICD-10-CM | POA: Diagnosis not present

## 2016-08-14 DIAGNOSIS — A419 Sepsis, unspecified organism: Secondary | ICD-10-CM | POA: Diagnosis not present

## 2016-08-14 DIAGNOSIS — G40909 Epilepsy, unspecified, not intractable, without status epilepticus: Secondary | ICD-10-CM | POA: Diagnosis not present

## 2016-08-14 DIAGNOSIS — K5641 Fecal impaction: Secondary | ICD-10-CM | POA: Diagnosis not present

## 2016-09-03 DIAGNOSIS — J690 Pneumonitis due to inhalation of food and vomit: Secondary | ICD-10-CM | POA: Diagnosis not present

## 2016-09-03 DIAGNOSIS — G8250 Quadriplegia, unspecified: Secondary | ICD-10-CM | POA: Diagnosis not present

## 2016-09-03 DIAGNOSIS — A419 Sepsis, unspecified organism: Secondary | ICD-10-CM | POA: Diagnosis not present

## 2016-09-03 DIAGNOSIS — G40909 Epilepsy, unspecified, not intractable, without status epilepticus: Secondary | ICD-10-CM

## 2016-09-04 DIAGNOSIS — G40909 Epilepsy, unspecified, not intractable, without status epilepticus: Secondary | ICD-10-CM | POA: Diagnosis not present

## 2016-09-04 DIAGNOSIS — A419 Sepsis, unspecified organism: Secondary | ICD-10-CM | POA: Diagnosis not present

## 2016-09-04 DIAGNOSIS — G8250 Quadriplegia, unspecified: Secondary | ICD-10-CM | POA: Diagnosis not present

## 2016-09-04 DIAGNOSIS — J690 Pneumonitis due to inhalation of food and vomit: Secondary | ICD-10-CM | POA: Diagnosis not present

## 2016-09-05 DIAGNOSIS — J690 Pneumonitis due to inhalation of food and vomit: Secondary | ICD-10-CM | POA: Diagnosis not present

## 2016-09-05 DIAGNOSIS — G40909 Epilepsy, unspecified, not intractable, without status epilepticus: Secondary | ICD-10-CM | POA: Diagnosis not present

## 2016-09-05 DIAGNOSIS — G8250 Quadriplegia, unspecified: Secondary | ICD-10-CM | POA: Diagnosis not present

## 2016-09-05 DIAGNOSIS — A419 Sepsis, unspecified organism: Secondary | ICD-10-CM | POA: Diagnosis not present

## 2016-09-06 DIAGNOSIS — G40909 Epilepsy, unspecified, not intractable, without status epilepticus: Secondary | ICD-10-CM | POA: Diagnosis not present

## 2016-09-06 DIAGNOSIS — G8250 Quadriplegia, unspecified: Secondary | ICD-10-CM | POA: Diagnosis not present

## 2016-09-06 DIAGNOSIS — J690 Pneumonitis due to inhalation of food and vomit: Secondary | ICD-10-CM | POA: Diagnosis not present

## 2016-09-06 DIAGNOSIS — A419 Sepsis, unspecified organism: Secondary | ICD-10-CM | POA: Diagnosis not present

## 2016-09-07 DIAGNOSIS — G40909 Epilepsy, unspecified, not intractable, without status epilepticus: Secondary | ICD-10-CM | POA: Diagnosis not present

## 2016-09-07 DIAGNOSIS — J690 Pneumonitis due to inhalation of food and vomit: Secondary | ICD-10-CM | POA: Diagnosis not present

## 2016-09-07 DIAGNOSIS — G8250 Quadriplegia, unspecified: Secondary | ICD-10-CM | POA: Diagnosis not present

## 2016-09-07 DIAGNOSIS — A419 Sepsis, unspecified organism: Secondary | ICD-10-CM | POA: Diagnosis not present

## 2017-04-18 IMAGING — CR DG CHEST 1V PORT
1 series · 1 of 1 positions shown · non-contrast
Comparison: None.

CLINICAL DATA: Nausea and vomiting.

EXAM:
PORTABLE CHEST 1 VIEW

[AP]
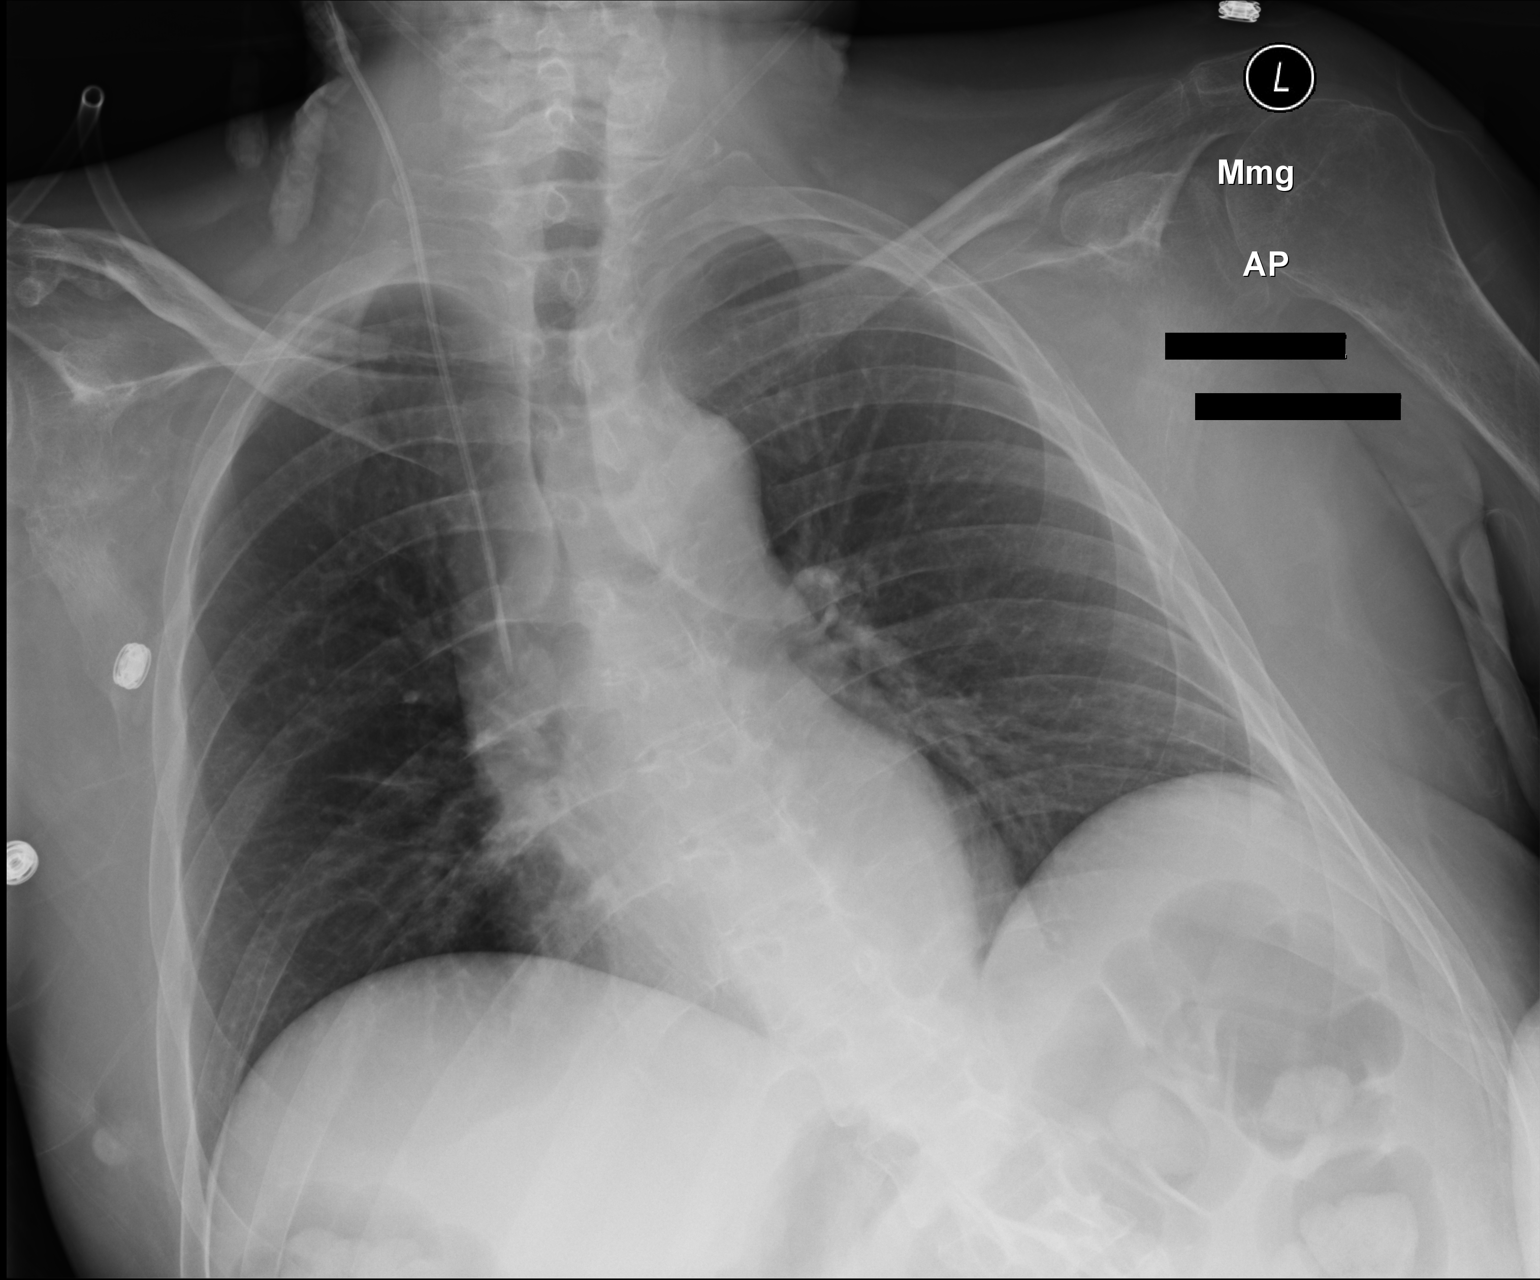

[1 of 1 positions shown; findings below may reference images not displayed]

FINDINGS: There is a right jugular central line with tip in the SVC just below
the azygos vein junction. The lungs are clear. There is no
pneumothorax. There is no large effusion. Pulmonary vasculature is
normal.
IMPRESSION: No acute cardiopulmonary findings. Satisfactorily positioned right
jugular central line.

## 2017-04-20 IMAGING — XA IR CATHETER TUBE CHANGE
1 series · 2 of 2 positions shown · non-contrast
Comparison: none

CLINICAL DATA: Dislodged gastrostomy tube. Foley catheter placed in
gastrostomy tube tract.

[Series 1: run · 2 of 2 slices shown]
[im 1/2]
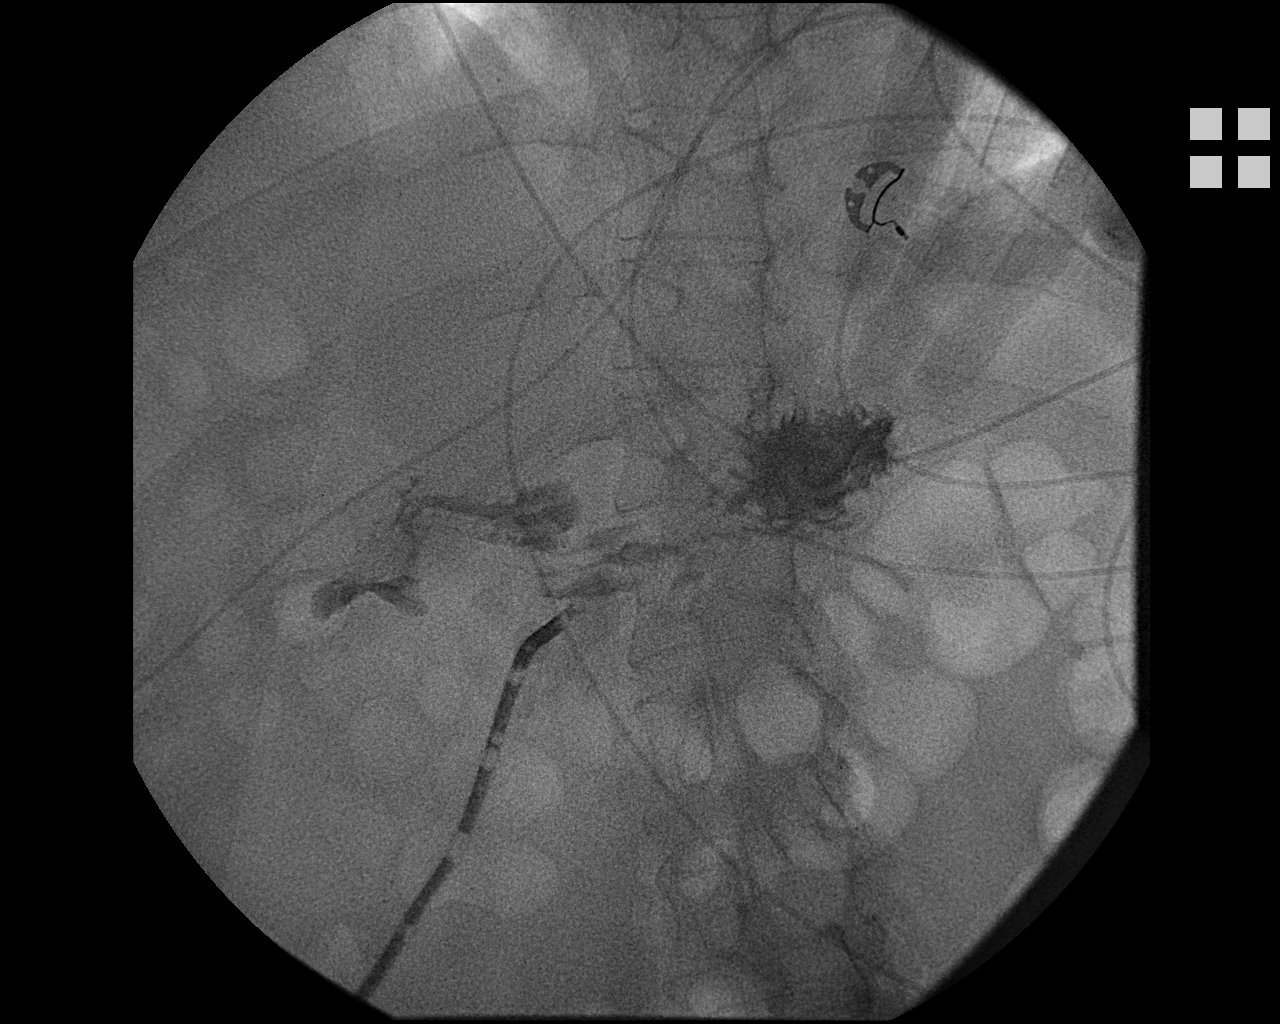
[im 2/2]
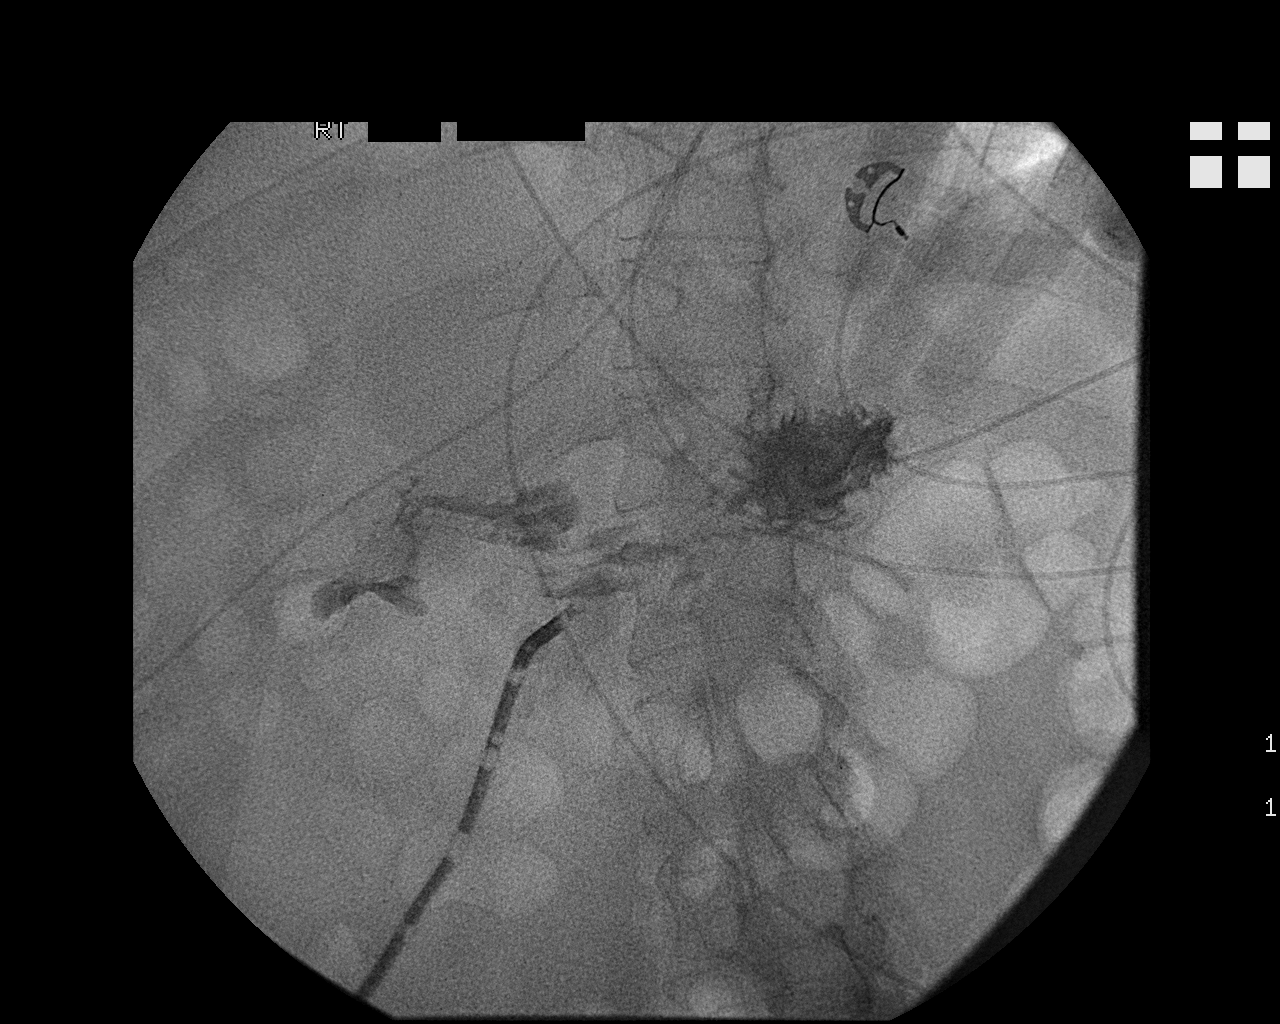

[2 of 2 positions shown; findings below may reference images not displayed]

EXAM:
REPLACEMENT OF GASTROSTOMY TUBE WITH FLUOROSCOPY

FLUOROSCOPY TIME:  6 seconds, 2.7 mGy

MEDICATIONS AND MEDICAL HISTORY:
None

ANESTHESIA/SEDATION:
Moderate sedation time: None

CONTRAST:  10 mL Cmnipaque-QYY

PROCEDURE:
The existing Foley catheter was deflated and removed. A new 18
French balloon retention catheter was easily advanced into the
stomach. Balloon was inflated with 9 mL of sterile saline. Contrast
was injected to confirm placement in the stomach. Fluoroscopic
images were taken and saved for this procedure.
FINDINGS: Gastrostomy tube positioned in the stomach.

Estimated blood loss: None

COMPLICATIONS:
None
IMPRESSION: Successful replacement of the gastrostomy tube with fluoroscopy.

## 2017-06-25 DIAGNOSIS — G8250 Quadriplegia, unspecified: Secondary | ICD-10-CM | POA: Diagnosis not present

## 2017-06-25 DIAGNOSIS — I10 Essential (primary) hypertension: Secondary | ICD-10-CM | POA: Diagnosis not present

## 2017-06-25 DIAGNOSIS — K219 Gastro-esophageal reflux disease without esophagitis: Secondary | ICD-10-CM | POA: Diagnosis not present

## 2017-06-25 DIAGNOSIS — K920 Hematemesis: Secondary | ICD-10-CM | POA: Diagnosis not present

## 2017-06-25 DIAGNOSIS — A419 Sepsis, unspecified organism: Secondary | ICD-10-CM

## 2017-06-25 DIAGNOSIS — J690 Pneumonitis due to inhalation of food and vomit: Secondary | ICD-10-CM | POA: Diagnosis not present

## 2017-06-26 DIAGNOSIS — J690 Pneumonitis due to inhalation of food and vomit: Secondary | ICD-10-CM | POA: Diagnosis not present

## 2017-06-26 DIAGNOSIS — K219 Gastro-esophageal reflux disease without esophagitis: Secondary | ICD-10-CM | POA: Diagnosis not present

## 2017-06-26 DIAGNOSIS — I10 Essential (primary) hypertension: Secondary | ICD-10-CM | POA: Diagnosis not present

## 2017-06-26 DIAGNOSIS — G8250 Quadriplegia, unspecified: Secondary | ICD-10-CM | POA: Diagnosis not present

## 2017-06-26 DIAGNOSIS — A419 Sepsis, unspecified organism: Secondary | ICD-10-CM | POA: Diagnosis not present

## 2017-06-26 DIAGNOSIS — K920 Hematemesis: Secondary | ICD-10-CM | POA: Diagnosis not present

## 2017-06-27 DIAGNOSIS — K920 Hematemesis: Secondary | ICD-10-CM | POA: Diagnosis not present

## 2017-06-27 DIAGNOSIS — A419 Sepsis, unspecified organism: Secondary | ICD-10-CM | POA: Diagnosis not present

## 2017-06-27 DIAGNOSIS — I10 Essential (primary) hypertension: Secondary | ICD-10-CM | POA: Diagnosis not present

## 2017-06-27 DIAGNOSIS — K219 Gastro-esophageal reflux disease without esophagitis: Secondary | ICD-10-CM | POA: Diagnosis not present

## 2017-06-27 DIAGNOSIS — G8250 Quadriplegia, unspecified: Secondary | ICD-10-CM | POA: Diagnosis not present

## 2017-06-27 DIAGNOSIS — J690 Pneumonitis due to inhalation of food and vomit: Secondary | ICD-10-CM | POA: Diagnosis not present

## 2017-06-28 DIAGNOSIS — I10 Essential (primary) hypertension: Secondary | ICD-10-CM | POA: Diagnosis not present

## 2017-06-28 DIAGNOSIS — A419 Sepsis, unspecified organism: Secondary | ICD-10-CM | POA: Diagnosis not present

## 2017-06-28 DIAGNOSIS — G8250 Quadriplegia, unspecified: Secondary | ICD-10-CM | POA: Diagnosis not present

## 2017-06-28 DIAGNOSIS — K920 Hematemesis: Secondary | ICD-10-CM | POA: Diagnosis not present

## 2017-06-28 DIAGNOSIS — J690 Pneumonitis due to inhalation of food and vomit: Secondary | ICD-10-CM | POA: Diagnosis not present

## 2017-06-28 DIAGNOSIS — K219 Gastro-esophageal reflux disease without esophagitis: Secondary | ICD-10-CM | POA: Diagnosis not present

## 2017-09-05 DIAGNOSIS — A419 Sepsis, unspecified organism: Secondary | ICD-10-CM

## 2017-09-05 DIAGNOSIS — K668 Other specified disorders of peritoneum: Secondary | ICD-10-CM

## 2017-09-05 DIAGNOSIS — J690 Pneumonitis due to inhalation of food and vomit: Secondary | ICD-10-CM

## 2017-09-06 DIAGNOSIS — A419 Sepsis, unspecified organism: Secondary | ICD-10-CM | POA: Diagnosis not present

## 2017-09-06 DIAGNOSIS — J690 Pneumonitis due to inhalation of food and vomit: Secondary | ICD-10-CM | POA: Diagnosis not present

## 2017-09-06 DIAGNOSIS — K668 Other specified disorders of peritoneum: Secondary | ICD-10-CM | POA: Diagnosis not present

## 2017-09-07 DIAGNOSIS — J690 Pneumonitis due to inhalation of food and vomit: Secondary | ICD-10-CM | POA: Diagnosis not present

## 2017-09-07 DIAGNOSIS — K668 Other specified disorders of peritoneum: Secondary | ICD-10-CM | POA: Diagnosis not present

## 2017-09-07 DIAGNOSIS — A419 Sepsis, unspecified organism: Secondary | ICD-10-CM | POA: Diagnosis not present

## 2017-09-08 DIAGNOSIS — A419 Sepsis, unspecified organism: Secondary | ICD-10-CM | POA: Diagnosis not present

## 2017-09-08 DIAGNOSIS — J690 Pneumonitis due to inhalation of food and vomit: Secondary | ICD-10-CM | POA: Diagnosis not present

## 2017-09-08 DIAGNOSIS — K668 Other specified disorders of peritoneum: Secondary | ICD-10-CM | POA: Diagnosis not present

## 2017-09-09 DIAGNOSIS — A419 Sepsis, unspecified organism: Secondary | ICD-10-CM | POA: Diagnosis not present

## 2017-09-09 DIAGNOSIS — K668 Other specified disorders of peritoneum: Secondary | ICD-10-CM | POA: Diagnosis not present

## 2017-09-09 DIAGNOSIS — J690 Pneumonitis due to inhalation of food and vomit: Secondary | ICD-10-CM | POA: Diagnosis not present

## 2018-03-09 DIAGNOSIS — Z66 Do not resuscitate: Secondary | ICD-10-CM

## 2018-03-09 DIAGNOSIS — K6389 Other specified diseases of intestine: Secondary | ICD-10-CM

## 2018-03-09 DIAGNOSIS — J690 Pneumonitis due to inhalation of food and vomit: Secondary | ICD-10-CM

## 2018-03-09 DIAGNOSIS — K529 Noninfective gastroenteritis and colitis, unspecified: Secondary | ICD-10-CM

## 2018-03-09 DIAGNOSIS — E876 Hypokalemia: Secondary | ICD-10-CM

## 2018-03-09 DIAGNOSIS — K922 Gastrointestinal hemorrhage, unspecified: Secondary | ICD-10-CM

## 2018-03-09 DIAGNOSIS — K920 Hematemesis: Secondary | ICD-10-CM

## 2018-03-09 DIAGNOSIS — N390 Urinary tract infection, site not specified: Secondary | ICD-10-CM

## 2018-03-09 DIAGNOSIS — Z931 Gastrostomy status: Secondary | ICD-10-CM

## 2018-03-10 DIAGNOSIS — N390 Urinary tract infection, site not specified: Secondary | ICD-10-CM | POA: Diagnosis not present

## 2018-09-17 DIAGNOSIS — D72829 Elevated white blood cell count, unspecified: Secondary | ICD-10-CM | POA: Diagnosis not present

## 2018-09-17 DIAGNOSIS — G809 Cerebral palsy, unspecified: Secondary | ICD-10-CM

## 2018-09-17 DIAGNOSIS — E860 Dehydration: Secondary | ICD-10-CM | POA: Diagnosis not present

## 2018-09-17 DIAGNOSIS — R1110 Vomiting, unspecified: Secondary | ICD-10-CM | POA: Diagnosis not present

## 2018-09-17 DIAGNOSIS — K920 Hematemesis: Secondary | ICD-10-CM | POA: Diagnosis not present

## 2019-03-12 DIAGNOSIS — K6389 Other specified diseases of intestine: Secondary | ICD-10-CM | POA: Diagnosis not present

## 2019-03-12 DIAGNOSIS — J690 Pneumonitis due to inhalation of food and vomit: Secondary | ICD-10-CM | POA: Diagnosis not present

## 2019-03-12 DIAGNOSIS — E876 Hypokalemia: Secondary | ICD-10-CM

## 2019-03-12 DIAGNOSIS — N390 Urinary tract infection, site not specified: Secondary | ICD-10-CM

## 2019-03-12 DIAGNOSIS — S069X9A Unspecified intracranial injury with loss of consciousness of unspecified duration, initial encounter: Secondary | ICD-10-CM

## 2019-03-12 DIAGNOSIS — G8250 Quadriplegia, unspecified: Secondary | ICD-10-CM

## 2019-03-12 DIAGNOSIS — A419 Sepsis, unspecified organism: Secondary | ICD-10-CM | POA: Diagnosis not present

## 2019-03-13 DIAGNOSIS — A419 Sepsis, unspecified organism: Secondary | ICD-10-CM | POA: Diagnosis not present

## 2019-03-13 DIAGNOSIS — N390 Urinary tract infection, site not specified: Secondary | ICD-10-CM | POA: Diagnosis not present

## 2019-03-13 DIAGNOSIS — J690 Pneumonitis due to inhalation of food and vomit: Secondary | ICD-10-CM | POA: Diagnosis not present

## 2019-03-13 DIAGNOSIS — K6389 Other specified diseases of intestine: Secondary | ICD-10-CM | POA: Diagnosis not present

## 2019-03-14 DIAGNOSIS — A419 Sepsis, unspecified organism: Secondary | ICD-10-CM | POA: Diagnosis not present

## 2019-03-14 DIAGNOSIS — N390 Urinary tract infection, site not specified: Secondary | ICD-10-CM | POA: Diagnosis not present

## 2019-03-14 DIAGNOSIS — J690 Pneumonitis due to inhalation of food and vomit: Secondary | ICD-10-CM | POA: Diagnosis not present

## 2019-03-14 DIAGNOSIS — K6389 Other specified diseases of intestine: Secondary | ICD-10-CM | POA: Diagnosis not present

## 2019-03-15 DIAGNOSIS — N390 Urinary tract infection, site not specified: Secondary | ICD-10-CM | POA: Diagnosis not present

## 2019-03-15 DIAGNOSIS — K6389 Other specified diseases of intestine: Secondary | ICD-10-CM | POA: Diagnosis not present

## 2019-03-15 DIAGNOSIS — A419 Sepsis, unspecified organism: Secondary | ICD-10-CM | POA: Diagnosis not present

## 2019-03-15 DIAGNOSIS — J690 Pneumonitis due to inhalation of food and vomit: Secondary | ICD-10-CM | POA: Diagnosis not present

## 2019-03-16 DIAGNOSIS — N390 Urinary tract infection, site not specified: Secondary | ICD-10-CM | POA: Diagnosis not present

## 2019-03-16 DIAGNOSIS — A419 Sepsis, unspecified organism: Secondary | ICD-10-CM | POA: Diagnosis not present

## 2019-03-16 DIAGNOSIS — J690 Pneumonitis due to inhalation of food and vomit: Secondary | ICD-10-CM | POA: Diagnosis not present

## 2019-03-16 DIAGNOSIS — K6389 Other specified diseases of intestine: Secondary | ICD-10-CM | POA: Diagnosis not present

## 2019-03-17 DIAGNOSIS — J690 Pneumonitis due to inhalation of food and vomit: Secondary | ICD-10-CM | POA: Diagnosis not present

## 2019-03-17 DIAGNOSIS — N390 Urinary tract infection, site not specified: Secondary | ICD-10-CM | POA: Diagnosis not present

## 2019-03-17 DIAGNOSIS — A419 Sepsis, unspecified organism: Secondary | ICD-10-CM | POA: Diagnosis not present

## 2019-03-17 DIAGNOSIS — K6389 Other specified diseases of intestine: Secondary | ICD-10-CM | POA: Diagnosis not present

## 2019-03-18 DIAGNOSIS — K6389 Other specified diseases of intestine: Secondary | ICD-10-CM | POA: Diagnosis not present

## 2019-03-18 DIAGNOSIS — A419 Sepsis, unspecified organism: Secondary | ICD-10-CM | POA: Diagnosis not present

## 2019-03-18 DIAGNOSIS — J690 Pneumonitis due to inhalation of food and vomit: Secondary | ICD-10-CM | POA: Diagnosis not present

## 2019-03-18 DIAGNOSIS — N390 Urinary tract infection, site not specified: Secondary | ICD-10-CM | POA: Diagnosis not present

## 2019-03-19 DIAGNOSIS — J690 Pneumonitis due to inhalation of food and vomit: Secondary | ICD-10-CM | POA: Diagnosis not present

## 2019-03-19 DIAGNOSIS — A419 Sepsis, unspecified organism: Secondary | ICD-10-CM | POA: Diagnosis not present

## 2019-03-19 DIAGNOSIS — N390 Urinary tract infection, site not specified: Secondary | ICD-10-CM | POA: Diagnosis not present

## 2019-03-19 DIAGNOSIS — K6389 Other specified diseases of intestine: Secondary | ICD-10-CM | POA: Diagnosis not present

## 2019-03-20 DIAGNOSIS — N390 Urinary tract infection, site not specified: Secondary | ICD-10-CM | POA: Diagnosis not present

## 2019-03-20 DIAGNOSIS — J690 Pneumonitis due to inhalation of food and vomit: Secondary | ICD-10-CM | POA: Diagnosis not present

## 2019-03-20 DIAGNOSIS — A419 Sepsis, unspecified organism: Secondary | ICD-10-CM | POA: Diagnosis not present

## 2019-03-20 DIAGNOSIS — K6389 Other specified diseases of intestine: Secondary | ICD-10-CM | POA: Diagnosis not present

## 2021-12-14 DIAGNOSIS — R000 Tachycardia, unspecified: Secondary | ICD-10-CM | POA: Diagnosis not present

## 2022-01-27 DIAGNOSIS — A419 Sepsis, unspecified organism: Secondary | ICD-10-CM | POA: Diagnosis not present
